# Patient Record
Sex: Female | Born: 1970 | Race: Black or African American | Hispanic: No | Marital: Single | Smoking: Never smoker
Health system: Southern US, Community
[De-identification: ages and names within clinical notes are randomized; demographics above are authoritative.]

## PROBLEM LIST (undated history)

## (undated) DIAGNOSIS — K219 Gastro-esophageal reflux disease without esophagitis: Secondary | ICD-10-CM

## (undated) DIAGNOSIS — F209 Schizophrenia, unspecified: Secondary | ICD-10-CM

## (undated) DIAGNOSIS — I10 Essential (primary) hypertension: Secondary | ICD-10-CM

## (undated) DIAGNOSIS — E669 Obesity, unspecified: Secondary | ICD-10-CM

---

## 2006-03-03 ENCOUNTER — Emergency Department: Payer: Self-pay | Admitting: Emergency Medicine

## 2008-11-02 ENCOUNTER — Ambulatory Visit: Payer: Self-pay | Admitting: Neurology

## 2009-03-21 ENCOUNTER — Emergency Department: Payer: Self-pay | Admitting: Emergency Medicine

## 2009-07-19 ENCOUNTER — Emergency Department: Payer: Self-pay | Admitting: Emergency Medicine

## 2009-10-20 ENCOUNTER — Emergency Department: Payer: Self-pay | Admitting: Emergency Medicine

## 2011-04-21 ENCOUNTER — Emergency Department: Payer: Self-pay | Admitting: Emergency Medicine

## 2011-06-05 ENCOUNTER — Emergency Department: Payer: Self-pay | Admitting: Emergency Medicine

## 2012-02-02 ENCOUNTER — Emergency Department: Payer: Self-pay | Admitting: Emergency Medicine

## 2012-03-03 ENCOUNTER — Emergency Department: Payer: Self-pay | Admitting: Emergency Medicine

## 2012-03-03 LAB — CBC
MCH: 29.2 pg (ref 26.0–34.0)
MCV: 86 fL (ref 80–100)
Platelet: 287 10*3/uL (ref 150–440)
RBC: 4.04 10*6/uL (ref 3.80–5.20)
RDW: 16.2 % — ABNORMAL HIGH (ref 11.5–14.5)
WBC: 7 10*3/uL (ref 3.6–11.0)

## 2012-03-03 LAB — COMPREHENSIVE METABOLIC PANEL
Albumin: 3.4 g/dL (ref 3.4–5.0)
Alkaline Phosphatase: 105 U/L (ref 50–136)
Bilirubin,Total: 0.2 mg/dL (ref 0.2–1.0)
Calcium, Total: 9.1 mg/dL (ref 8.5–10.1)
Co2: 30 mmol/L (ref 21–32)
Creatinine: 0.95 mg/dL (ref 0.60–1.30)
EGFR (Non-African Amer.): >60
Glucose: 102 mg/dL — ABNORMAL HIGH (ref 65–99)
SGOT(AST): 16 U/L (ref 15–37)
SGPT (ALT): 33 U/L (ref 12–78)
Sodium: 141 mmol/L (ref 136–145)

## 2012-03-03 LAB — URINALYSIS, COMPLETE
Bilirubin,UR: NEGATIVE
Glucose,UR: NEGATIVE mg/dL (ref 0–75)
Ketone: NEGATIVE
Leukocyte Esterase: NEGATIVE
RBC,UR: NONE SEEN /HPF (ref 0–5)
Squamous Epithelial: 10
WBC UR: 2 /HPF (ref 0–5)

## 2012-03-27 ENCOUNTER — Emergency Department: Payer: Self-pay | Admitting: Emergency Medicine

## 2012-03-27 LAB — COMPREHENSIVE METABOLIC PANEL
Alkaline Phosphatase: 113 U/L (ref 50–136)
Anion Gap: 7 (ref 7–16)
Bilirubin,Total: 0.2 mg/dL (ref 0.2–1.0)
Calcium, Total: 9.2 mg/dL (ref 8.5–10.1)
Chloride: 105 mmol/L (ref 98–107)
Creatinine: 0.94 mg/dL (ref 0.60–1.30)
EGFR (African American): >60
Glucose: 99 mg/dL (ref 65–99)
Potassium: 4 mmol/L (ref 3.5–5.1)
SGOT(AST): 14 U/L — ABNORMAL LOW (ref 15–37)
Sodium: 141 mmol/L (ref 136–145)

## 2012-03-27 LAB — URINALYSIS, COMPLETE
Blood: NEGATIVE
Ketone: NEGATIVE
Nitrite: NEGATIVE
Ph: 9 (ref 4.5–8.0)
Protein: NEGATIVE
RBC,UR: 1 /HPF (ref 0–5)
Specific Gravity: 1.008 (ref 1.003–1.030)
Squamous Epithelial: 1
WBC UR: 1 /HPF (ref 0–5)

## 2012-03-27 LAB — LIPASE, BLOOD: Lipase: 162 U/L (ref 73–393)

## 2012-03-27 LAB — CBC
MCHC: 32.9 g/dL (ref 32.0–36.0)
MCV: 87 fL (ref 80–100)
Platelet: 336 10*3/uL (ref 150–440)
RDW: 16 % — ABNORMAL HIGH (ref 11.5–14.5)

## 2012-05-23 ENCOUNTER — Emergency Department: Payer: Self-pay | Admitting: Emergency Medicine

## 2012-05-23 LAB — CBC
HCT: 39.7 % (ref 35.0–47.0)
HGB: 13 g/dL (ref 12.0–16.0)
MCH: 28.1 pg (ref 26.0–34.0)
MCV: 85 fL (ref 80–100)
RBC: 4.65 10*6/uL (ref 3.80–5.20)
RDW: 14.8 % — ABNORMAL HIGH (ref 11.5–14.5)
WBC: 7.3 10*3/uL (ref 3.6–11.0)

## 2012-05-23 LAB — URINALYSIS, COMPLETE
Bacteria: NONE SEEN
Bilirubin,UR: NEGATIVE
Blood: NEGATIVE
Glucose,UR: NEGATIVE mg/dL (ref 0–75)
Ketone: NEGATIVE
Ph: 8 (ref 4.5–8.0)
Specific Gravity: 1.01 (ref 1.003–1.030)
Squamous Epithelial: 7
WBC UR: 3 /HPF (ref 0–5)

## 2012-05-23 LAB — COMPREHENSIVE METABOLIC PANEL
Alkaline Phosphatase: 136 U/L (ref 50–136)
Anion Gap: 7 (ref 7–16)
BUN: 11 mg/dL (ref 7–18)
Bilirubin,Total: 0.2 mg/dL (ref 0.2–1.0)
Calcium, Total: 9.6 mg/dL (ref 8.5–10.1)
Chloride: 105 mmol/L (ref 98–107)
Co2: 27 mmol/L (ref 21–32)
Creatinine: 1.05 mg/dL (ref 0.60–1.30)
EGFR (African American): >60
EGFR (Non-African Amer.): >60
Glucose: 91 mg/dL (ref 65–99)
Sodium: 139 mmol/L (ref 136–145)
Total Protein: 9.1 g/dL — ABNORMAL HIGH (ref 6.4–8.2)

## 2012-05-23 LAB — LIPASE, BLOOD: Lipase: 151 U/L (ref 73–393)

## 2012-07-24 ENCOUNTER — Emergency Department: Payer: Self-pay | Admitting: Unknown Physician Specialty

## 2012-07-24 LAB — COMPREHENSIVE METABOLIC PANEL
Alkaline Phosphatase: 113 U/L (ref 50–136)
Anion Gap: 4 — ABNORMAL LOW (ref 7–16)
BUN: 10 mg/dL (ref 7–18)
Co2: 29 mmol/L (ref 21–32)
Creatinine: 1 mg/dL (ref 0.60–1.30)
EGFR (African American): >60
Osmolality: 283 (ref 275–301)
Potassium: 4.2 mmol/L (ref 3.5–5.1)
SGOT(AST): 20 U/L (ref 15–37)
SGPT (ALT): 30 U/L (ref 12–78)
Sodium: 142 mmol/L (ref 136–145)
Total Protein: 7.7 g/dL (ref 6.4–8.2)

## 2012-07-24 LAB — CBC
HGB: 11.5 g/dL — ABNORMAL LOW (ref 12.0–16.0)
MCH: 27.2 pg (ref 26.0–34.0)
MCHC: 32.4 g/dL (ref 32.0–36.0)
RBC: 4.22 10*6/uL (ref 3.80–5.20)
WBC: 6.5 10*3/uL (ref 3.6–11.0)

## 2012-07-24 LAB — TROPONIN I: Troponin-I: <0.02 ng/mL

## 2012-07-24 LAB — URINALYSIS, COMPLETE
Bacteria: NONE SEEN
Bilirubin,UR: NEGATIVE
Glucose,UR: NEGATIVE mg/dL (ref 0–75)
Nitrite: NEGATIVE
Ph: 8 (ref 4.5–8.0)
Protein: NEGATIVE
Specific Gravity: 1.002 (ref 1.003–1.030)
Squamous Epithelial: 11

## 2013-03-31 ENCOUNTER — Encounter (HOSPITAL_COMMUNITY): Payer: Self-pay | Admitting: Emergency Medicine

## 2013-03-31 ENCOUNTER — Emergency Department (HOSPITAL_COMMUNITY)
Admission: EM | Admit: 2013-03-31 | Discharge: 2013-03-31 | Disposition: A | Discharge status: Home / Self Care | Payer: BC Managed Care – PPO | Attending: Emergency Medicine | Admitting: Emergency Medicine

## 2013-03-31 DIAGNOSIS — Z3202 Encounter for pregnancy test, result negative: Secondary | ICD-10-CM | POA: Insufficient documentation

## 2013-03-31 DIAGNOSIS — R17 Unspecified jaundice: Secondary | ICD-10-CM | POA: Insufficient documentation

## 2013-03-31 DIAGNOSIS — E119 Type 2 diabetes mellitus without complications: Secondary | ICD-10-CM | POA: Insufficient documentation

## 2013-03-31 DIAGNOSIS — R1012 Left upper quadrant pain: Secondary | ICD-10-CM | POA: Insufficient documentation

## 2013-03-31 DIAGNOSIS — R109 Unspecified abdominal pain: Secondary | ICD-10-CM

## 2013-03-31 DIAGNOSIS — R5381 Other malaise: Secondary | ICD-10-CM | POA: Insufficient documentation

## 2013-03-31 DIAGNOSIS — R1013 Epigastric pain: Secondary | ICD-10-CM | POA: Insufficient documentation

## 2013-03-31 DIAGNOSIS — R6883 Chills (without fever): Secondary | ICD-10-CM | POA: Insufficient documentation

## 2013-03-31 DIAGNOSIS — F209 Schizophrenia, unspecified: Secondary | ICD-10-CM | POA: Insufficient documentation

## 2013-03-31 DIAGNOSIS — K219 Gastro-esophageal reflux disease without esophagitis: Secondary | ICD-10-CM | POA: Insufficient documentation

## 2013-03-31 DIAGNOSIS — R51 Headache: Secondary | ICD-10-CM | POA: Insufficient documentation

## 2013-03-31 DIAGNOSIS — E669 Obesity, unspecified: Secondary | ICD-10-CM | POA: Insufficient documentation

## 2013-03-31 DIAGNOSIS — Z79899 Other long term (current) drug therapy: Secondary | ICD-10-CM | POA: Insufficient documentation

## 2013-03-31 HISTORY — DX: Gastro-esophageal reflux disease without esophagitis: K21.9

## 2013-03-31 HISTORY — DX: Obesity, unspecified: E66.9

## 2013-03-31 HISTORY — DX: Schizophrenia, unspecified: F20.9

## 2013-03-31 LAB — COMPREHENSIVE METABOLIC PANEL
Albumin: 3.8 g/dL (ref 3.5–5.2)
Alkaline Phosphatase: 104 U/L (ref 39–117)
BUN: 13 mg/dL (ref 6–23)
CO2: 27 mEq/L (ref 19–32)
Calcium: 9.6 mg/dL (ref 8.4–10.5)
GFR calc Af Amer: 89 mL/min — ABNORMAL LOW (ref >90)
Glucose, Bld: 87 mg/dL (ref 70–99)
Potassium: 3.7 mEq/L (ref 3.5–5.1)
Sodium: 141 mEq/L (ref 135–145)
Total Protein: 8.2 g/dL (ref 6.0–8.3)

## 2013-03-31 LAB — CBC
HCT: 36.7 % (ref 36.0–46.0)
Hemoglobin: 12.1 g/dL (ref 12.0–15.0)
MCH: 28.3 pg (ref 26.0–34.0)
MCHC: 33 g/dL (ref 30.0–36.0)
MCV: 85.9 fL (ref 78.0–100.0)
RBC: 4.27 MIL/uL (ref 3.87–5.11)
RDW: 14.3 % (ref 11.5–15.5)
WBC: 7.2 10*3/uL (ref 4.0–10.5)

## 2013-03-31 LAB — URINALYSIS, ROUTINE W REFLEX MICROSCOPIC
Glucose, UA: NEGATIVE mg/dL
Ketones, ur: NEGATIVE mg/dL
Nitrite: NEGATIVE
Specific Gravity, Urine: 1.006 (ref 1.005–1.030)
pH: 6.5 (ref 5.0–8.0)

## 2013-03-31 LAB — LIPASE, BLOOD: Lipase: 35 U/L (ref 11–59)

## 2013-03-31 LAB — URINE MICROSCOPIC-ADD ON

## 2013-03-31 LAB — PREGNANCY, URINE: Preg Test, Ur: NEGATIVE

## 2013-03-31 MED ORDER — IBUPROFEN 800 MG PO TABS
800.0000 mg | ORAL_TABLET | Freq: Once | ORAL | Status: AC
  Administered 2013-03-31: 800 mg via ORAL
  Filled 2013-03-31: qty 4

## 2013-03-31 NOTE — ED Provider Notes (Signed)
CSN: [id number]     Arrival date & time 03/31/13  0215 History   First MD Initiated Contact with Patient 03/31/13 [phone]     Chief Complaint  Patient presents with  . Gastrophageal Reflux   (Consider location/radiation/quality/duration/timing/severity/associated sxs/prior Treatment) HPI Comments: The patient is a 42 year old female with a past medical history of schizophrenia, DM, presenting with multiple complains.  She reports chronic abdominal pain with multiple visits to [facility], [facility], and [facility].  She also complaints of a headache worsened by sensation of a BM and the valsalva manuver.  She reports lightheadedness. She also complains of "jumping" while falling asleep and has "a feeling something bad is going to happen".  LNMP January 16, 2013. The patient also reports Endoscopy in April 2014, abdominal US, H-pylori, abdominal CT scan.  Patient is a 42 y.o. female presenting with GERD. The history is provided by the patient.  Gastrophageal Reflux This is a chronic problem. The current episode started more than 1 year ago. The problem occurs constantly. The problem has been unchanged. Associated symptoms include abdominal pain, chills, fatigue, headaches and weakness. Pertinent negatives include no anorexia, chest pain, congestion, coughing, fever, myalgias, neck pain, numbness, rash or urinary symptoms. Treatments tried: Pepcid and Protonix. The treatment provided no relief.    Past Medical History  Diagnosis Date  . Schizophrenia   . Obesity   . GERD (gastroesophageal reflux disease)    History reviewed. No pertinent past surgical history. No family history on file. History  Substance Use Topics  . Smoking status: Never Smoker   . Smokeless tobacco: Not on file  . Alcohol Use: No   OB History   Grav Para Term Preterm Abortions TAB SAB Ect Mult Living                 Review of Systems  Constitutional: Positive for chills and fatigue. Negative for fever.  HENT: Negative  for congestion.   Respiratory: Negative for cough.   Cardiovascular: Negative for chest pain.  Gastrointestinal: Positive for abdominal pain. Negative for anorexia.  Musculoskeletal: Negative for myalgias and neck pain.  Skin: Negative for rash.  Neurological: Positive for weakness and headaches. Negative for numbness.    Allergies  Review of patient's allergies indicates no known allergies.  Home Medications   Current Outpatient Rx  Name  Route  Sig  Dispense  Refill  . carbamazepine (TEGRETOL) 100 MG chewable tablet   Oral   Chew 100 mg by mouth 2 (two) times daily.         . famotidine (PEPCID) 20 MG tablet   Oral   Take 20 mg by mouth 2 (two) times daily.         . pantoprazole (PROTONIX) 20 MG tablet   Oral   Take 20 mg by mouth 2 (two) times daily.          BP 123/67  Pulse 70  Temp(Src) 98.3 F (36.8 C) (Oral)  Resp 11  SpO2 100% Physical Exam  Nursing note and vitals reviewed. Constitutional: She is oriented to person, place, and time. Vital signs are normal. She appears well-developed and well-nourished. No distress.  HENT:  Head: Normocephalic and atraumatic.  Eyes: Scleral icterus is present.  Neck: Neck supple.  Cardiovascular: Normal rate, regular rhythm and normal heart sounds.   Pulmonary/Chest: Effort normal and breath sounds normal. No respiratory distress. She has no wheezes. She has no rales.  Abdominal: Soft. Bowel sounds are normal. She exhibits no distension.  There is tenderness in the epigastric area and left upper quadrant. There is no rebound, no guarding, no CVA tenderness, no tenderness at McBurney's point and negative Murphy's sign.  Abdominal exam limited by patient's body habitus.    Musculoskeletal: Normal range of motion.  Lymphadenopathy:    She has no cervical adenopathy.  Neurological: She is alert and oriented to person, place, and time.  Skin: Skin is warm and dry. No rash noted. She is not diaphoretic.    ED Course   Procedures (including critical care time) Labs Review Labs Reviewed  COMPREHENSIVE METABOLIC PANEL - Abnormal; Notable for the following:    GFR calc non Af Amer 77 (*)    GFR calc Af Amer 89 (*)    All other components within normal limits  URINALYSIS, ROUTINE W REFLEX MICROSCOPIC - Abnormal; Notable for the following:    APPearance CLOUDY (*)    Leukocytes, UA TRACE (*)    All other components within normal limits  URINE MICROSCOPIC-ADD ON - Abnormal; Notable for the following:    Squamous Epithelial / LPF MANY (*)    Bacteria, UA FEW (*)    All other components within normal limits  URINE CULTURE  LIPASE, BLOOD  CBC  PREGNANCY, URINE   Imaging Review No results found.  EKG Interpretation     Ventricular Rate:  87 PR Interval:  180 QRS Duration: 76 QT Interval:  365 QTC Calculation: 439 R Axis:   25 Text Interpretation:  Sinus rhythm Normal ECG No old tracing to compare            MDM   1. Abdominal pain    The patient with a year long history of abdominal pain that is not worsening.  Will order basic labs, lipase to evaluate pancrease given LUQ and epigastric tenderness.  Discussed patient history and condition with Dr. Hyacinth Meeker. EMR reveals the patient has had several imaging and testing in the past at various hospitals. Discussed test results and previous visits in the patient's medical record. He agrees no further workup given current state and lab results at this time and she is stable for discharge and to follow up with a Gi specialist or her PCP. Discussed lab results, imagine results, and treatment plan with the patient.  She she states "you doctors are just as worthless as the [facility] and [employer]".  Discussed in detail with the patient testing that we performed based on her symptoms and PE and that based on her history, labs and PE a specialists could follow her as an out patient for additional testing.  Offered to give the patient a referral to a GI  specialist but she refused to answer any further questions. I then stated I would have her follow up with her PCP and they could set her up with a GI specialist for further work up of her chronic abdominal pain.  Patient is stable for discharge at this time.  Meds given in ED:  Medications  ibuprofen (ADVIL,MOTRIN) tablet 800 mg (800 mg Oral Given 03/31/13 0430)    Discharge Medication List as of 03/31/2013  5:44 AM        Leotis Shames Doretha Imus, PA-C 04/04/13 1156

## 2013-03-31 NOTE — ED Notes (Signed)
Pt complains of pain in left hip, Across the upper abdomen

## 2013-03-31 NOTE — ED Notes (Signed)
Pt. reports progressing " acid reflux" with abdominal pain for the past several days , denies nausea or vomitting.

## 2013-03-31 NOTE — ED Notes (Signed)
Pt expressed that she doesn't feel safe at home or going out from home

## 2013-04-01 LAB — URINE CULTURE: Colony Count: 60000

## 2013-04-05 NOTE — ED Provider Notes (Signed)
42 year old female with a history of schizophrenia who also has some chronic abdominal pain and headaches, presents with a complaint of both headache and acid reflux. On exam the patient is obese, minimally tender in the epigastrium, no other significant tenderness, clear heart and lung sounds, normal mental status and neurologic exam.  Neurologic exam benign, no acute conditions that require further evaluation with radiology studies for laboratory workup other than what has arty been ordered, she has a essentially normal GFR, normal urinalysis, and an unremarkable EKG.  Multiple imaging studies have been obtained at other hospitals, she does not a surgical abdomen and appears stable for discharge  Medical screening examination/treatment/procedure(s) were conducted as a shared visit with non-physician practitioner(s) and myself.  I personally evaluated the patient during the encounter.       Vida Roller, MD 04/05/13 [phone]

## 2013-05-20 IMAGING — US ABDOMEN ULTRASOUND LIMITED
1 series · 14 of 25 positions shown · non-contrast
Comparison: none

REASON FOR EXAM: epigastric pain
COMMENTS:   Body Site: GB and Fossa, CBD, Head of Pancreas

[Series 1: abdomen ultrasound limited · 0.36mm/px · 14 of 25 slices shown]
[im 1/25]
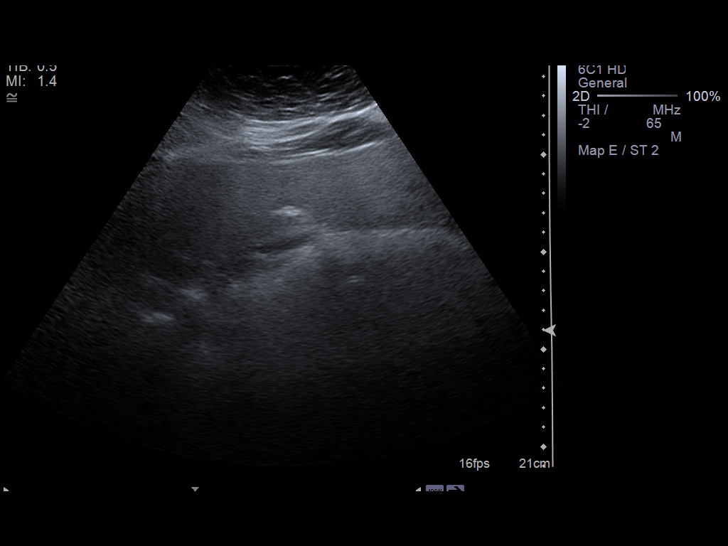
[im 3/25]
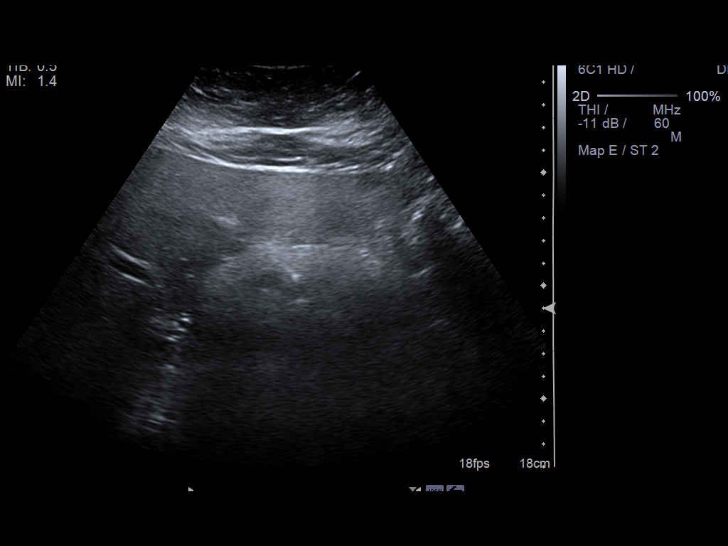
[im 5/25]
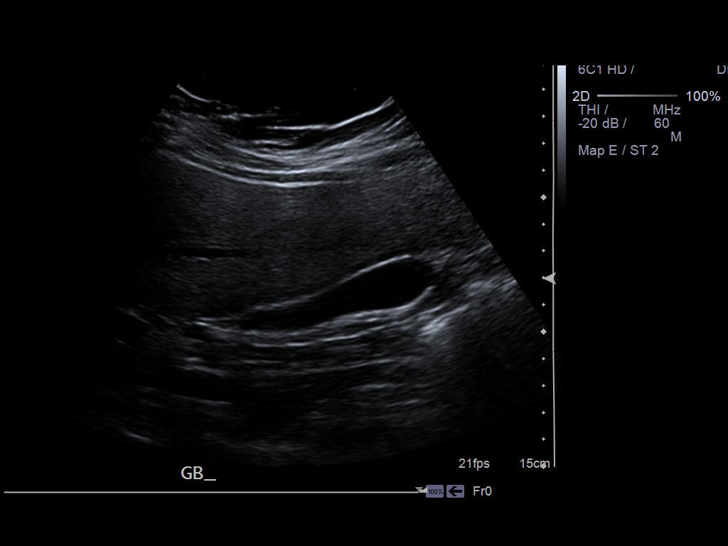
[im 7/25]
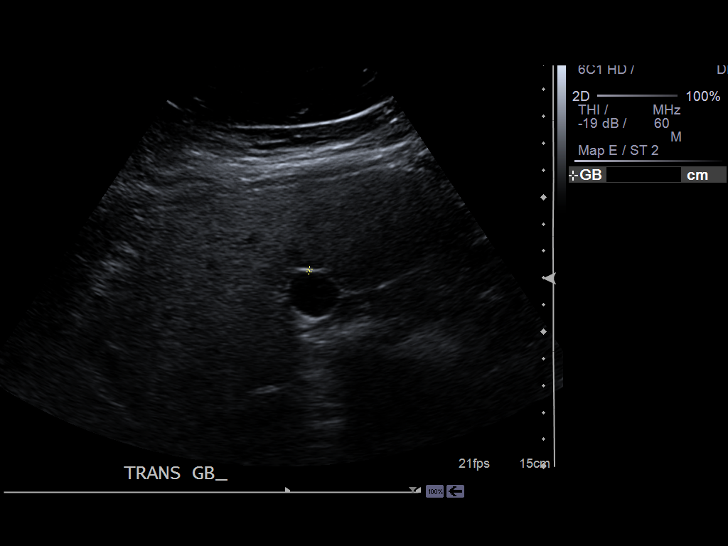
[im 9/25]
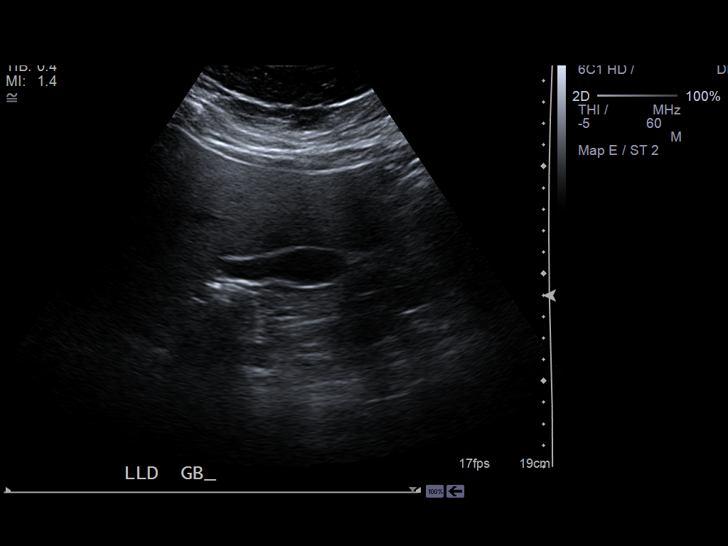
[im 10/25]
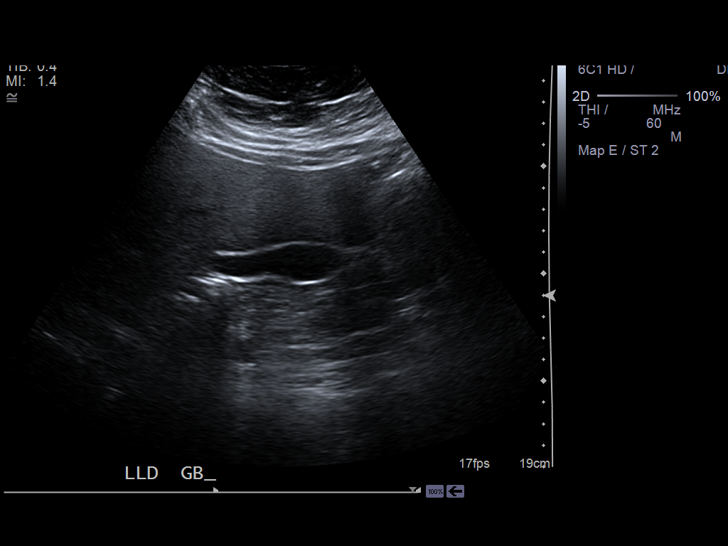
[im 12/25]
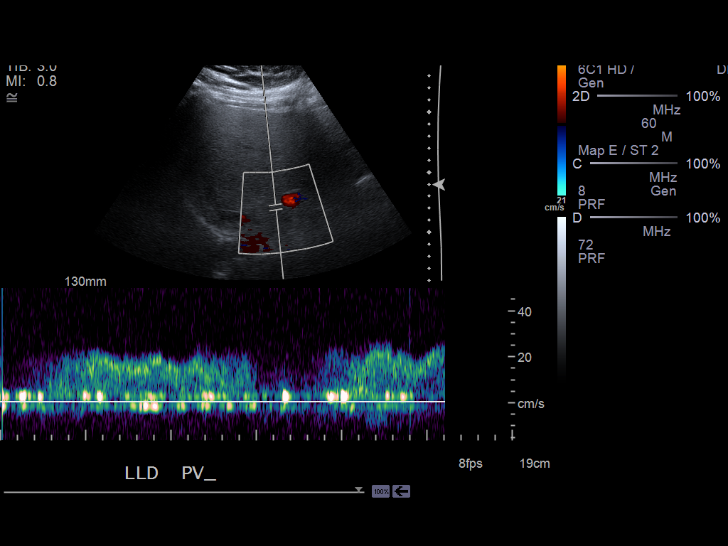
[im 14/25]
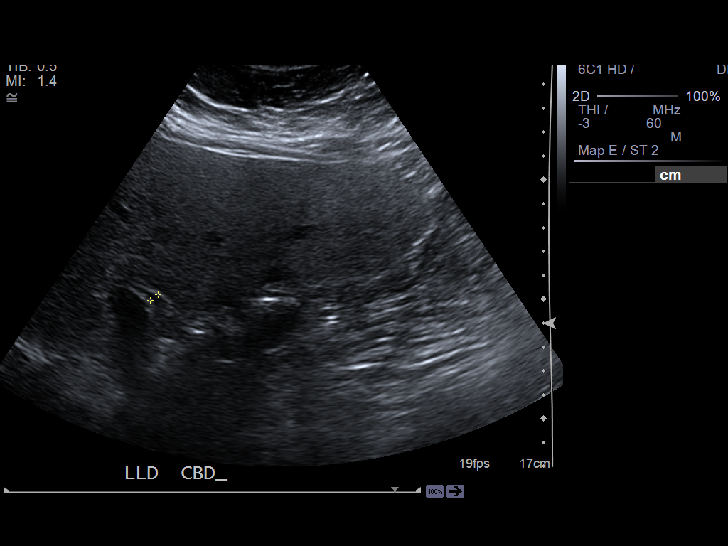
[im 16/25]
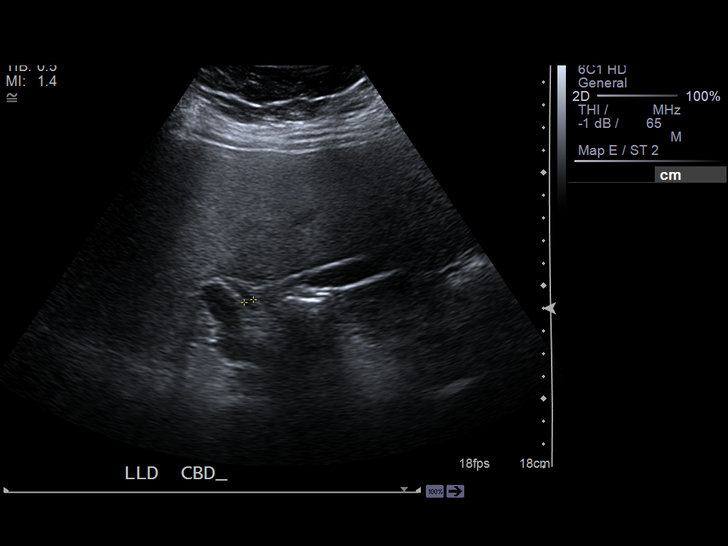
[im 17/25]
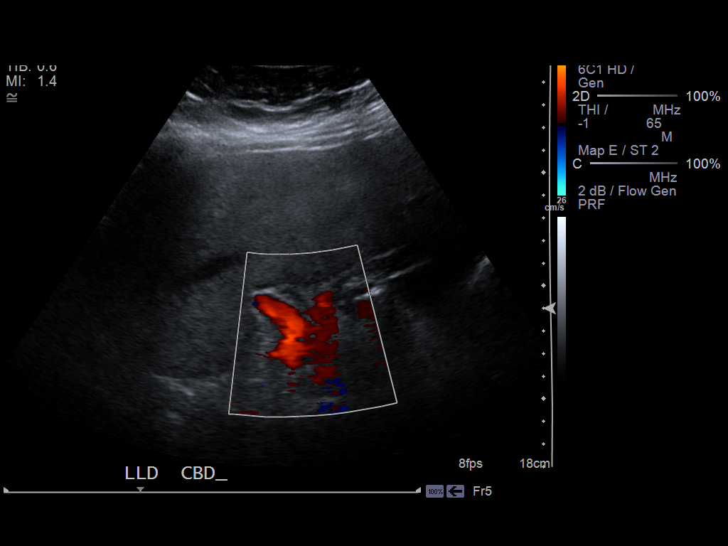
[im 19/25]
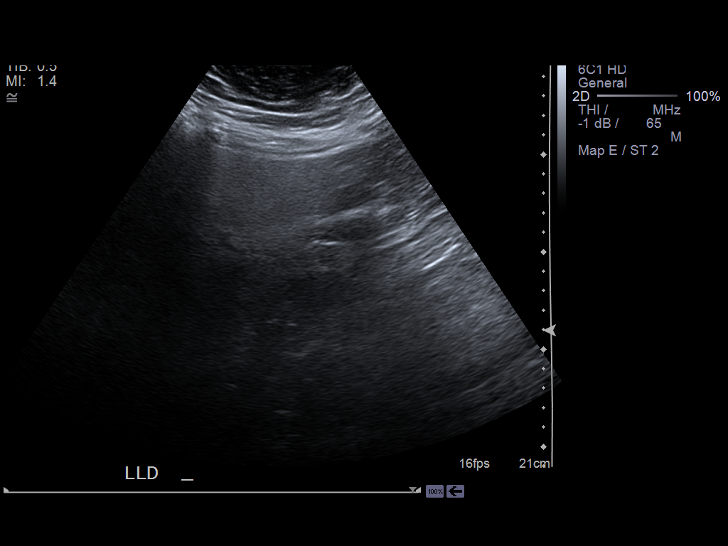
[im 21/25]
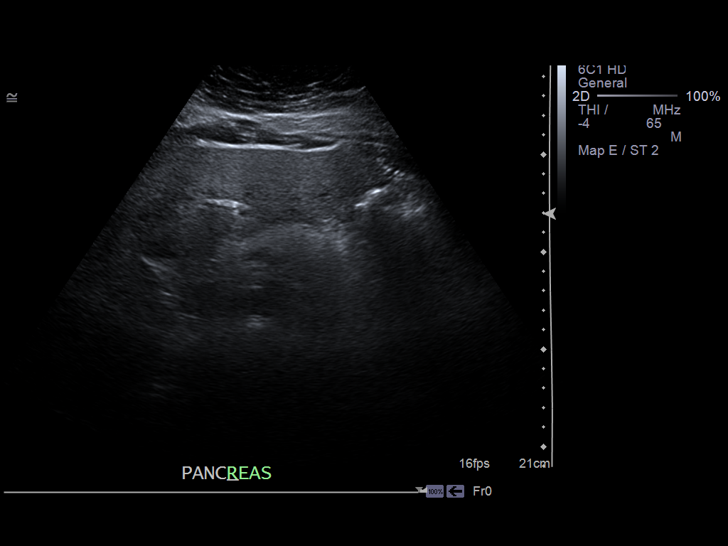
[im 23/25]
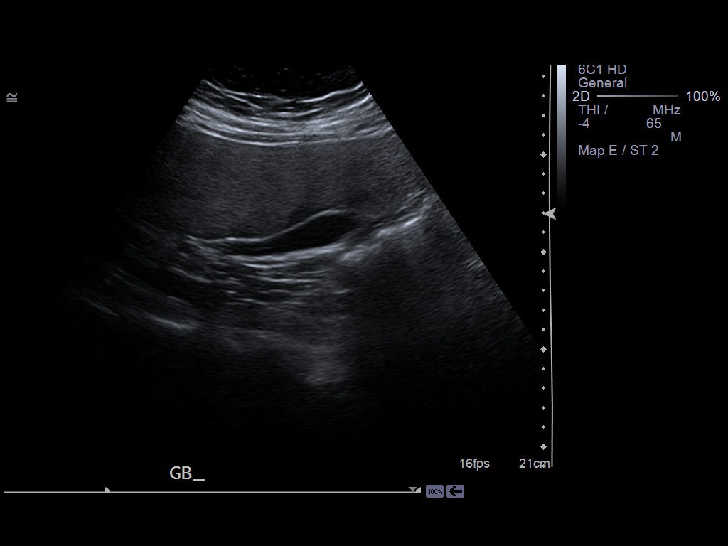
[im 25/25]
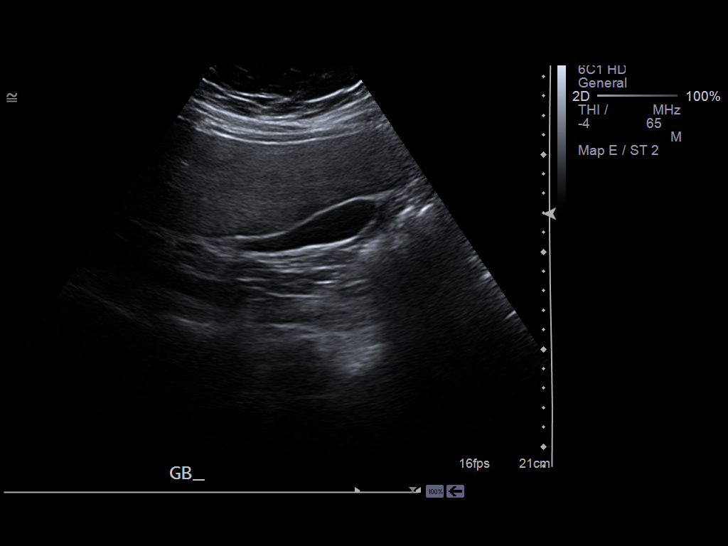

[14 of 25 positions shown; findings below may reference images not displayed]

PROCEDURE:     US  - US ABDOMEN LIMITED SURVEY  - March 03, 2012 [DATE]

RESULT:     The gallbladder shows no stones. The visualized pancreas appears
normal. Portions are secured by bowel gas. There is no sonographic Murphy's
sign. Gallbladder wall thickness is normal at 1.1 mm. The common bile duct
diameter is 4.0 mm. The portal venous flow appears normal. There is no
ascites.
IMPRESSION: Normal appearing right upper quadrant abdominal sonogram.
Limited visualization of the pancreas because of bowel gas.

[REDACTED]

## 2013-06-13 IMAGING — CT CT ABD-PELV W/ CM
1 of 2 series · 15 of 32 positions shown, 19 images · non-contrast
Comparison: none

REASON FOR EXAM: (1) left sided abbd pain, eval for diverticulitis; (2)
same
COMMENTS:

[Series 2: soft tissue · axial · 0.90mm/px · z∈[-931,-481]mm · 15 of 164 slices shown, 19 images]
[im 7/164  soft-tissue]
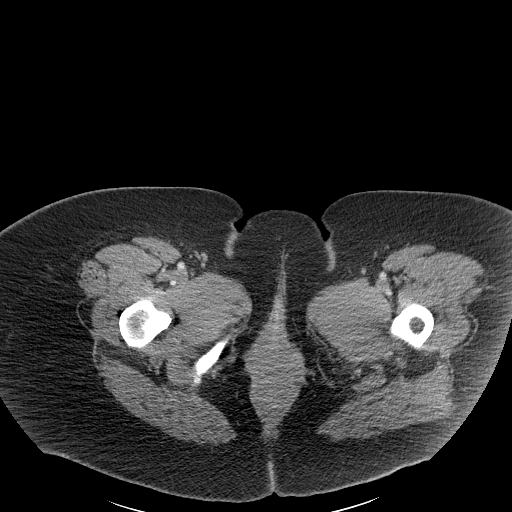
[im 7/164  bone]
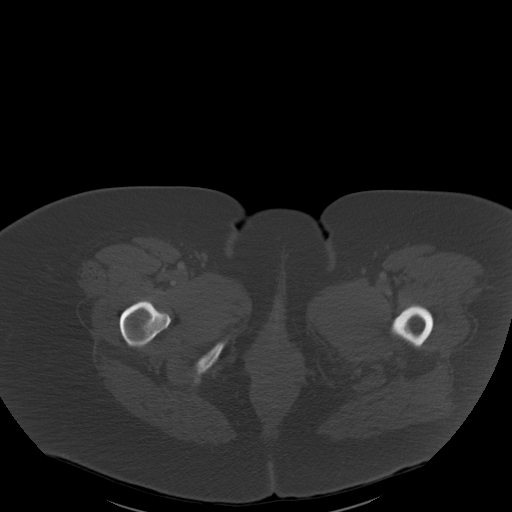
[im 21/164  soft-tissue]
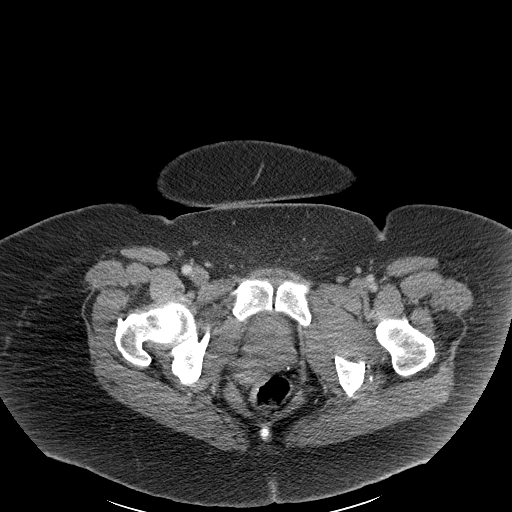
[im 34/164  soft-tissue]
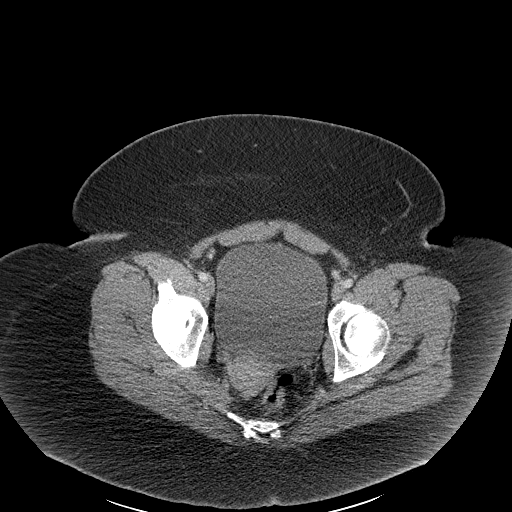
[im 48/164  soft-tissue]
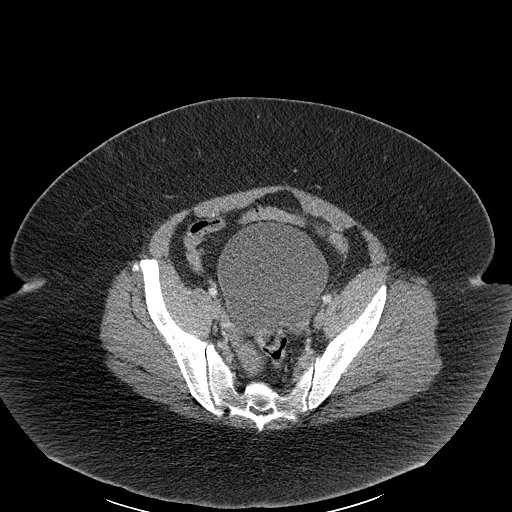
[im 55/164  soft-tissue]
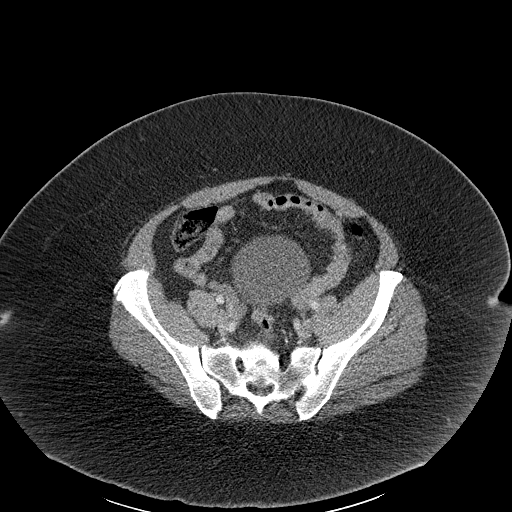
[im 68/164  soft-tissue]
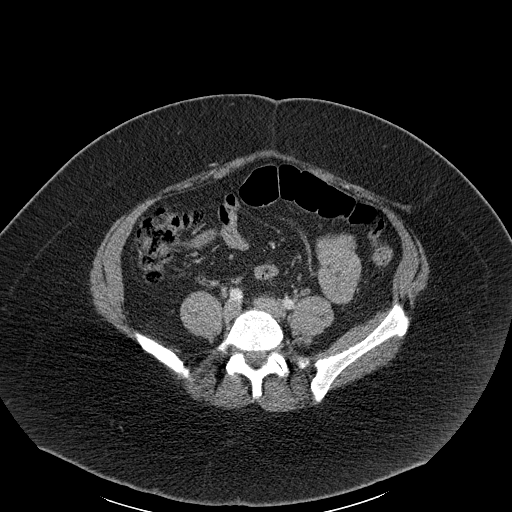
[im 82/164  soft-tissue]
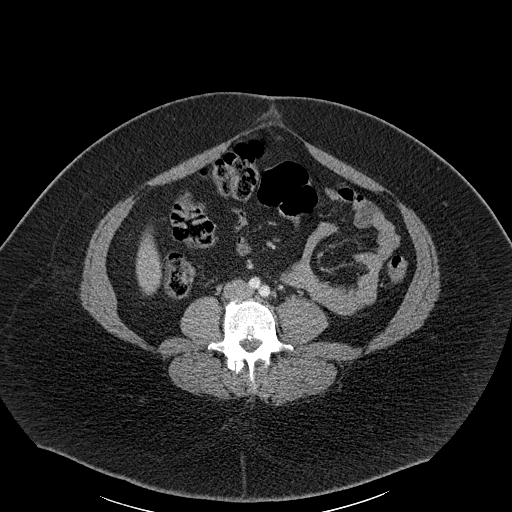
[im 96/164  soft-tissue]
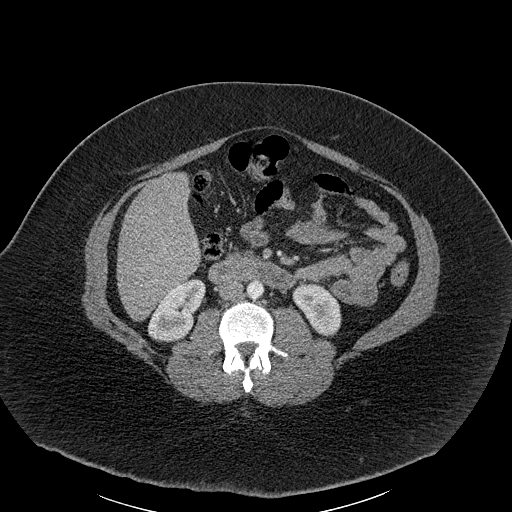
[im 109/164  soft-tissue]
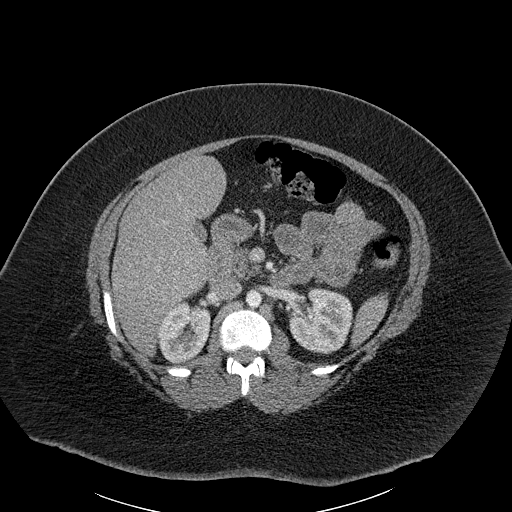
[im 109/164  bone]
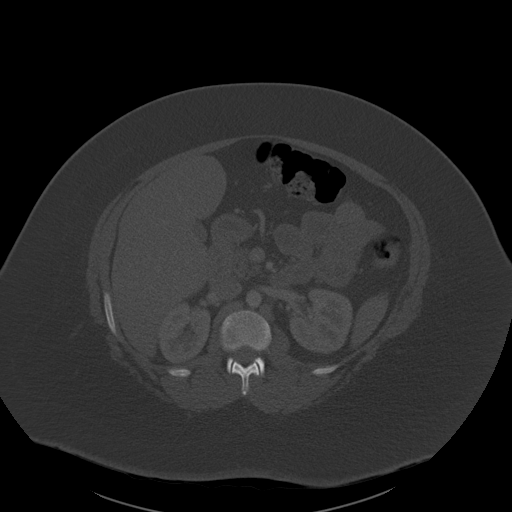
[im 116/164  soft-tissue]
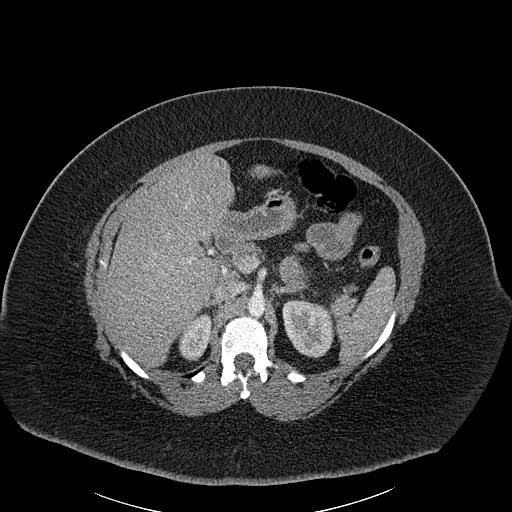
[im 130/164  soft-tissue]
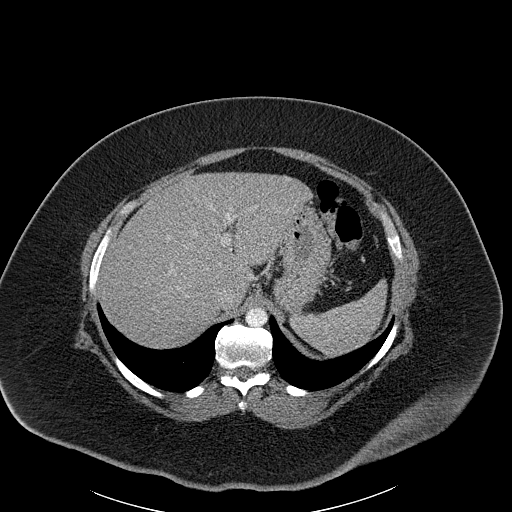
[im 136/164  lung]
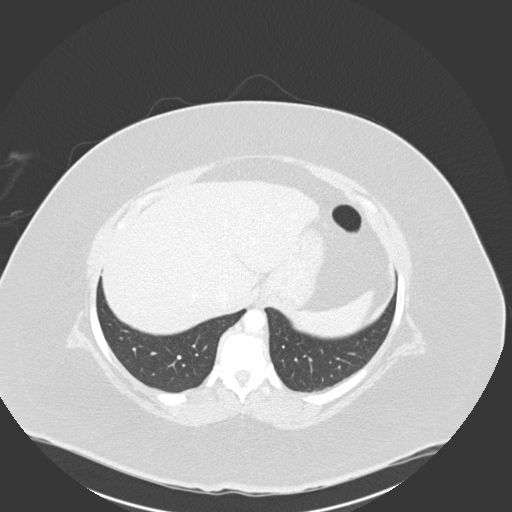
[im 143/164  soft-tissue]
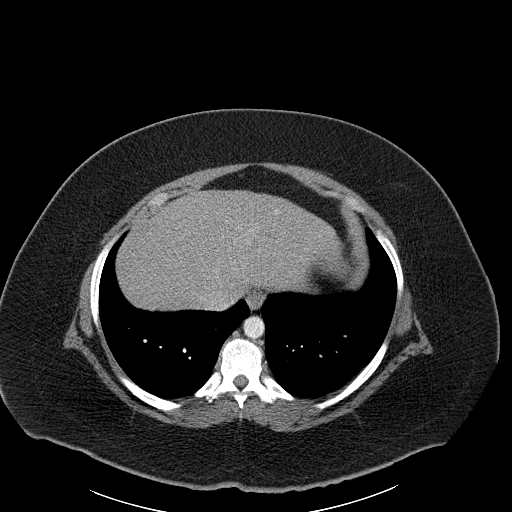
[im 143/164  lung]
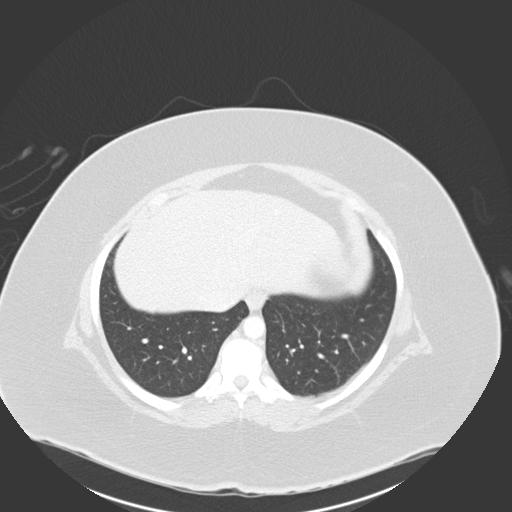
[im 150/164  lung]
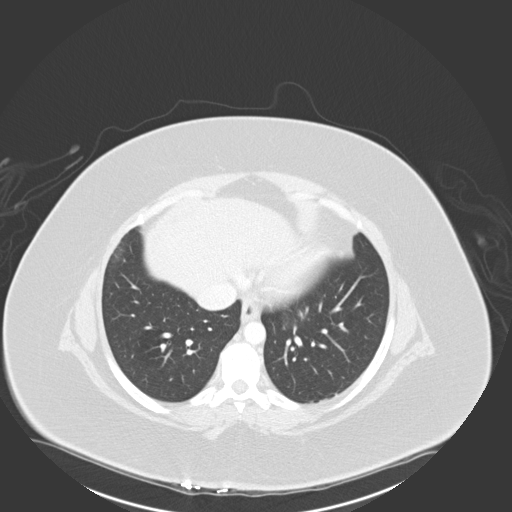
[im 157/164  soft-tissue]
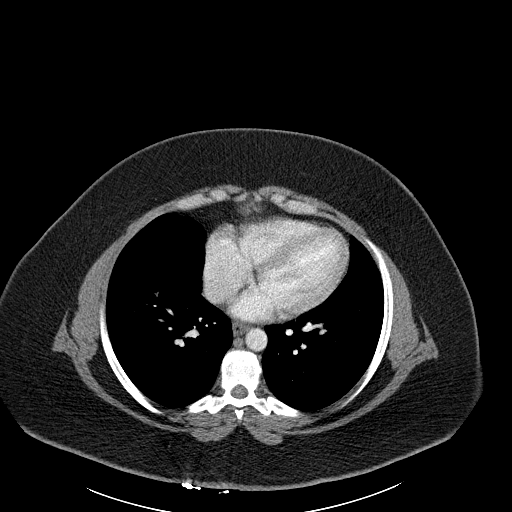
[im 157/164  lung]
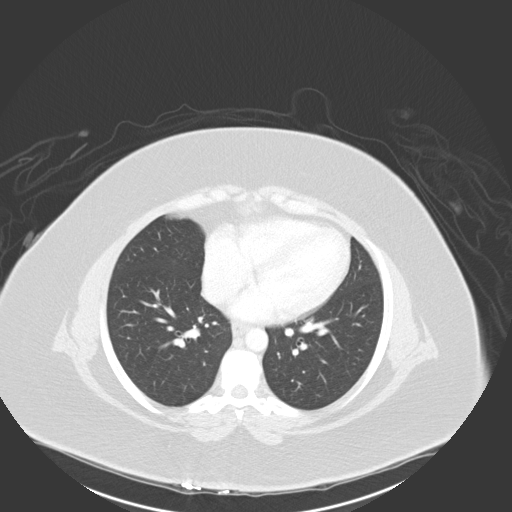

[15 of 32 positions shown; findings below may reference images not displayed]

PROCEDURE:     CT  - CT ABDOMEN / PELVIS  W  - March 27, 2012 [DATE]

RESULT:     CT of the abdomen and pelvis is performed with 100 mL of
6sovue-3MW iodinated intravenous contrast. The patient did not receive oral
contrast. There does not appear to be significant colonic diverticular
disease. There is no evidence of diverticulitis. There is a large fat filled
umbilical hernia. Urinary bladder contains a large amount of urine. The
uterus appears to be within normal limits. There is no adnexal mass.
Normal-appearing appendix is demonstrated. No abnormal colonic or small
bowel distention or wall thickening is evident. The liver, spleen, pancreas,
kidneys, aorta and adrenal glands appear unremarkable. There may be a tiny
stone in the gallbladder. Bony structures appear to be unremarkable. The
lung bases demonstrate no focal abnormality.
IMPRESSION: 1. Fat filled umbilical hernia.
2. Cannot exclude some tiny stones in the gallbladder. Correlate clinically.
3. No significant colonic diverticulosis. No evidence of diverticulitis.
4. Normal appearing appendix.
5. Large amount of urine in the bladder.

[REDACTED]

## 2013-12-12 ENCOUNTER — Emergency Department: Payer: Self-pay | Admitting: Emergency Medicine

## 2013-12-13 LAB — CBC
HCT: 36.1 % (ref 35.0–47.0)
HGB: 11.5 g/dL — ABNORMAL LOW (ref 12.0–16.0)
MCH: 27.2 pg (ref 26.0–34.0)
MCHC: 31.9 g/dL — ABNORMAL LOW (ref 32.0–36.0)
MCV: 85 fL (ref 80–100)
PLATELETS: 311 10*3/uL (ref 150–440)
RBC: 4.23 10*6/uL (ref 3.80–5.20)
RDW: 15.3 % — ABNORMAL HIGH (ref 11.5–14.5)
WBC: 8.5 10*3/uL (ref 3.6–11.0)

## 2014-04-02 ENCOUNTER — Emergency Department: Payer: Self-pay | Admitting: Emergency Medicine

## 2014-09-07 ENCOUNTER — Emergency Department
Admit: 2014-09-07 | Disposition: A | Discharge status: Home / Self Care | Payer: Self-pay | Admitting: Emergency Medicine

## 2014-09-07 LAB — COMPREHENSIVE METABOLIC PANEL
ALK PHOS: 94 U/L
ANION GAP: 7 (ref 7–16)
AST: 22 U/L
Albumin: 3.5 g/dL
BUN: 13 mg/dL
Bilirubin,Total: 0.2 mg/dL — ABNORMAL LOW
CALCIUM: 8.9 mg/dL
CHLORIDE: 105 mmol/L
Co2: 27 mmol/L
Creatinine: 0.9 mg/dL
EGFR (African American): >60
EGFR (Non-African Amer.): >60
Glucose: 121 mg/dL — ABNORMAL HIGH
POTASSIUM: 4 mmol/L
SGPT (ALT): 22 U/L
Sodium: 139 mmol/L
Total Protein: 7.2 g/dL

## 2014-09-07 LAB — CBC WITH DIFFERENTIAL/PLATELET
BASOS ABS: 0.1 10*3/uL (ref 0.0–0.1)
BASOS PCT: 0.9 %
EOS ABS: 0.2 10*3/uL (ref 0.0–0.7)
EOS PCT: 2.4 %
HCT: 32.7 % — ABNORMAL LOW (ref 35.0–47.0)
HGB: 10.4 g/dL — AB (ref 12.0–16.0)
LYMPHS ABS: 2.6 10*3/uL (ref 1.0–3.6)
Lymphocyte %: 39.7 %
MCH: 25.8 pg — AB (ref 26.0–34.0)
MCHC: 31.7 g/dL — ABNORMAL LOW (ref 32.0–36.0)
MCV: 81 fL (ref 80–100)
MONO ABS: 0.5 x10 3/mm (ref 0.2–0.9)
Monocyte %: 7.4 %
Neutrophil #: 3.2 10*3/uL (ref 1.4–6.5)
Neutrophil %: 49.6 %
PLATELETS: 307 10*3/uL (ref 150–440)
RBC: 4.02 10*6/uL (ref 3.80–5.20)
RDW: 15.3 % — ABNORMAL HIGH (ref 11.5–14.5)
WBC: 6.5 10*3/uL (ref 3.6–11.0)

## 2014-09-07 LAB — URINALYSIS, COMPLETE
BACTERIA: NONE SEEN
Bilirubin,UR: NEGATIVE
Blood: NEGATIVE
GLUCOSE, UR: NEGATIVE mg/dL (ref 0–75)
Ketone: NEGATIVE
LEUKOCYTE ESTERASE: NEGATIVE
NITRITE: NEGATIVE
PH: 6 (ref 4.5–8.0)
Protein: NEGATIVE
RBC,UR: NONE SEEN /HPF (ref 0–5)
Specific Gravity: 1.003 (ref 1.003–1.030)

## 2014-09-07 LAB — LIPASE, BLOOD: LIPASE: 35 U/L

## 2014-12-04 ENCOUNTER — Emergency Department
Admission: EM | Admit: 2014-12-04 | Discharge: 2014-12-04 | Disposition: A | Discharge status: Home / Self Care | Payer: BC Managed Care – PPO | Attending: Emergency Medicine | Admitting: Emergency Medicine

## 2014-12-04 DIAGNOSIS — K6289 Other specified diseases of anus and rectum: Secondary | ICD-10-CM | POA: Insufficient documentation

## 2014-12-04 MED ORDER — HYDROCORTISONE 2.5 % RE CREA: 1.0000 "application " | TOPICAL_CREAM | Freq: Two times a day (BID) | RECTAL | Status: AC

## 2014-12-04 NOTE — ED Provider Notes (Signed)
[facility] Emergency Department Provider Note     Time seen: ----------------------------------------- 7:54 AM on 12/04/2014 -----------------------------------------    I have reviewed the triage vital signs and the nursing notes.   HISTORY  Chief Complaint Rectal Pain    HPI Kari Mueller is a 44 y.o. female who presents ER with worsening rectal pain. Rectal pain is severe this time. Patient states she was diagnosed with an anal fissure about 3 weeks ago and was put on a gel to apply to this region. She states the pain is not gotten any better, denies any fever. States she may have had some chills.   Past Medical History  Diagnosis Date  . Schizophrenia   . Obesity   . GERD (gastroesophageal reflux disease)     There are no active problems to display for this patient.   No past surgical history on file.  Allergies Review of patient's allergies indicates no known allergies.  Social History History  Substance Use Topics  . Smoking status: Never Smoker   . Smokeless tobacco: Not on file  . Alcohol Use: No    Review of Systems Constitutional: Negative for fever. Respiratory: Negative for shortness of breath. Gastrointestinal: Negative for abdominal pain, vomiting and diarrhea. positive for rectal pain  Skin: Negative for rash. Neurological: Negative for headaches, focal weakness or numbness.  10-point ROS otherwise negative.  ____________________________________________   PHYSICAL EXAM:  VITAL SIGNS: ED Triage Vitals  Enc Vitals Group     BP 12/04/14 0747 147/89 mmHg     Pulse Rate 12/04/14 0747 88     Resp 12/04/14 0747 18     Temp 12/04/14 0747 98.3 F (36.8 C)     Temp Source 12/04/14 0747 Oral     SpO2 12/04/14 0747 97 %     Weight 12/04/14 0747 312 lb (141.522 kg)     Height 12/04/14 0747 5\' 6"  (1.676 m)     Head Cir --      Peak Flow --      Pain Score 12/04/14 0752 9     Pain Loc --      Pain Edu? --    Excl. in GC? --    Constitutional: Alert and oriented. Well appearing and in no distress. Rectal:  Patient with soft external hemorrhoids, rectal area is diffusely tender to touch, there does appear to be a possible anal fissure posteriorly Musculoskeletal: Nontender with normal range of motion in all extremities. No joint effusions.  No lower extremity tenderness nor edema. Neurologic:  Normal speech and language. Skin:  Skin is warm, dry and intact. No rash noted. Psychiatric: Mood and affect are normal.   ____________________________________________  ED COURSE:  Pertinent labs & imaging results that were available during my care of the patient were reviewed by me and considered in my medical decision making (see chart for details). I will review her primary care doctor's office notes. We'll change her rectal medication.  ____________________________________________    RADIOLOGY   none   ____________________________________________  FINAL ASSESSMENT AND PLAN  Rectal pain  Plan: Patient be switched to Anusol HC. She does have some hemorrhoids. She stable for outpatient follow-up with her primary care doctor.  Emily FilbertWilliams, Jonathan E, MD   Emily FilbertJonathan E Williams, MD 12/04/14 [phone]

## 2014-12-04 NOTE — ED Notes (Signed)
Patient to ED with c/o worsening rectal pain.

## 2014-12-04 NOTE — Discharge Instructions (Signed)
Proctitis °Proctitis is the swelling and soreness (inflammation) of the lining of the rectum. The rectum is at the end of the large intestine and is attached to the anus. The inflammation causes pain and discomfort. It may be short-term (acute) or long-lasting (chronic). °CAUSES °Inflammation in the rectum can be caused by many things, such as: °· Sexually transmitted diseases (STDs). °· Infection. °· Anal-rectal trauma or injury. °· Ulcerative colitis or Crohn's disease. °· Radiation therapy directed near the rectum. °· Antibiotic therapy. °SYMPTOMS °· Sudden, uncomfortable, and frequent urge to have a bowel movement. °· Anal or rectal pain. °· Abdominal cramping or pain. °· Sensation that the rectum is full. °· Rectal bleeding. °· Pus or mucus discharge from anus. °· Diarrhea or frequent soft, loose stools. °DIAGNOSIS °Diagnosis may include the following: °· A history and physical exam. °· An STD test. °· Blood tests. °· Stool tests. °· Rectal culture. °· A procedure to evaluate the anal canal (anoscopy). °· Procedures to look at part, or the entire large bowel (sigmoidoscopy, colonoscopy). °TREATMENT °Treatment of proctitis depends on the cause. Reducing the symptoms of inflammation and eliminating infection are the main goals of treatment. Treatment may include: °· Home remedies and lifestyle, such as sitz baths and avoiding food right before bedtime. °· Topical ointments, foams, suppositories, or enemas, such as corticosteroids or anti-inflammatories. °· Antibiotic or antiviral medicines to treat infection or to control harmful bacteria. °· Medicines to control diarrhea, soften stools, and reduce pain. °· Medicines to suppress the immune system. °· Avoiding the activity that caused rectal trauma. °· Nutritional, dietary, or herbal supplements. °· Heat or laser therapy for persistent bleeding. °· A dilation procedure to enlarge a narrowed rectum. °· Surgery, though rare, may be necessary to repair damaged rectal  lining. °HOME CARE INSTRUCTIONS °Only take medicines that are recommended or approved by your caregiver. Do not take anti-diarrhea medicine without your caregiver's approval. °SEEK MEDICAL CARE IF: °· You often experience one or more of the symptoms noted above. °· You keep experiencing symptoms after treatment. °· You have questions or concerns about your symptoms or treatment plan. °MAKE SURE YOU: °· Understand these instructions. °· Will watch your condition. °· Will get help right away if you are not doing well or get worse. °FOR MORE INFORMATION °National Institute of Diabetes and Digestive and Kidney Disease (NIDDK): www.digestive.niddk.hih.gov/ °Document Released: 04/30/2011 Document Revised: 09/05/2012 Document Reviewed: 04/30/2011 °ExitCare® Patient Information ©2015 [employer]. This information is not intended to replace advice given to you by your health care provider. Make sure you discuss any questions you have with your health care provider. ° °

## 2016-10-31 ENCOUNTER — Encounter: Payer: Self-pay | Admitting: Emergency Medicine

## 2016-10-31 ENCOUNTER — Emergency Department
Admission: EM | Admit: 2016-10-31 | Discharge: 2016-10-31 | Disposition: A | Discharge status: Home / Self Care | Payer: BC Managed Care – PPO | Attending: Emergency Medicine | Admitting: Emergency Medicine

## 2016-10-31 DIAGNOSIS — Z79899 Other long term (current) drug therapy: Secondary | ICD-10-CM | POA: Insufficient documentation

## 2016-10-31 DIAGNOSIS — J029 Acute pharyngitis, unspecified: Secondary | ICD-10-CM | POA: Insufficient documentation

## 2016-10-31 LAB — POCT RAPID STREP A: STREPTOCOCCUS, GROUP A SCREEN (DIRECT): NEGATIVE

## 2016-10-31 MED ORDER — AMOXICILLIN-POT CLAVULANATE 875-125 MG PO TABS: 1.0000 | ORAL_TABLET | Freq: Two times a day (BID) | ORAL | 0 refills | Status: AC

## 2016-10-31 MED ORDER — LIDOCAINE VISCOUS 2 % MT SOLN: 10.0000 mL | OROMUCOSAL | 0 refills | Status: AC | PRN

## 2016-10-31 MED ORDER — PREDNISONE 10 MG (21) PO TBPK: ORAL_TABLET | ORAL | 0 refills | Status: AC

## 2016-10-31 NOTE — ED Provider Notes (Signed)
[facility] Emergency Department Provider Note  ____________________________________________  Time seen: Approximately 11:38 AM  I have reviewed the triage vital signs and the nursing notes.   HISTORY  Chief Complaint Sore Throat    HPI Kari Mueller is a 46 y.o. female that presents to the emergency department with 2 weeks ofsore throat, nasal congestion, and nonproductive cough. She is eating and drinking normally but with pain. Patient states that she has been taking TheraFlu but symptoms have continued to get worse. She does not have diabetes. She denies fever, shortness breath, chest pain, nausea, vomiting, abdominal pain.   Past Medical History:  Diagnosis Date  . GERD (gastroesophageal reflux disease)   . Obesity   . Schizophrenia (HCC)     There are no active problems to display for this patient.   History reviewed. No pertinent surgical history.  Prior to Admission medications   Medication Sig Start Date End Date Taking? Authorizing Provider  amoxicillin-clavulanate (AUGMENTIN) 875-125 MG tablet Take 1 tablet by mouth 2 (two) times daily. 10/31/16 11/10/16  Enid Derry, PA-C  carbamazepine (TEGRETOL) 100 MG chewable tablet Chew 100 mg by mouth 2 (two) times daily.    [provider]  famotidine (PEPCID) 20 MG tablet Take 20 mg by mouth 2 (two) times daily.    [provider]  hydrocortisone (ANUSOL-HC) 2.5 % rectal cream Place 1 application rectally 2 (two) times daily. 12/04/14   Emily Filbert, MD  lidocaine (XYLOCAINE) 2 % solution Use as directed 10 mLs in the mouth or throat as needed for mouth pain. 10/31/16   Enid Derry, PA-C  pantoprazole (PROTONIX) 20 MG tablet Take 20 mg by mouth 2 (two) times daily.    [provider]  predniSONE (STERAPRED UNI-PAK 21 TAB) 10 MG (21) TBPK tablet Take 6 tablets on day 1, take 5 tablets on day 2, take 4 tablets on day 3, take 3 tablets on day 4, take 2 tablets on  day 5, take 1 tablet on day 6 10/31/16   Enid Derry, PA-C    Allergies Patient has no known allergies.  No family history on file.  Social History Social History  Substance Use Topics  . Smoking status: Never Smoker  . Smokeless tobacco: Not on file  . Alcohol use No     Review of Systems  Constitutional: No fever/chills Eyes: No visual changes. No discharge. ENT: Positive for congestion and rhinorrhea. Positive for sore throat. Cardiovascular: No chest pain. Respiratory: Positive for cough. No SOB. Gastrointestinal: No abdominal pain.  No nausea, no vomiting.  No diarrhea.  No constipation. Musculoskeletal: Negative for musculoskeletal pain. Skin: Negative for rash, abrasions, lacerations, ecchymosis. Neurological: Negative for headaches.   ____________________________________________   PHYSICAL EXAM:  VITAL SIGNS: ED Triage Vitals  Enc Vitals Group     BP 10/31/16 0956 (!) 146/87     Pulse Rate 10/31/16 0956 87     Resp 10/31/16 0956 20     Temp 10/31/16 0956 98.2 F (36.8 C)     Temp Source 10/31/16 0956 Oral     SpO2 10/31/16 0956 100 %     Weight 10/31/16 0957 (!) 314 lb (142.4 kg)     Height 10/31/16 0957 5\' 7"  (1.702 m)     Head Circumference --      Peak Flow --      Pain Score 10/31/16 0955 8     Pain Loc --      Pain Edu? --  Excl. in GC? --      Constitutional: Alert and oriented. Well appearing and in no acute distress. Eyes: Conjunctivae are normal. PERRL. EOMI. No discharge. Head: Atraumatic. ENT: No frontal and maxillary sinus tenderness.      Ears: Tympanic membranes pearly gray with good landmarks. No discharge.      Nose: Mild congestion/rhinnorhea.      Mouth/Throat: Mucous membranes are moist. Oropharynx erythematous. Tonsils not enlarged. No exudates. Uvula midline. Neck: No stridor.   Hematological/Lymphatic/Immunilogical: No cervical lymphadenopathy. Cardiovascular: Normal rate, regular rhythm.  Good peripheral  circulation. Respiratory: Normal respiratory effort without tachypnea or retractions. Lungs CTAB. Good air entry to the bases with no decreased or absent breath sounds. Gastrointestinal: Bowel sounds 4 quadrants. Soft and nontender to palpation. No guarding or rigidity. No palpable masses. No distention. Musculoskeletal: Full range of motion to all extremities. No gross deformities appreciated. Neurologic:  Normal speech and language. No gross focal neurologic deficits are appreciated.  Skin:  Skin is warm, dry and intact. No rash noted.   ____________________________________________   LABS (all labs ordered are listed, but only abnormal results are displayed)  Labs Reviewed  POCT RAPID STREP A   ____________________________________________  EKG   ____________________________________________  RADIOLOGY  No results found.  ____________________________________________    PROCEDURES  Procedure(s) performed:    Procedures    Medications - No data to display   ____________________________________________   INITIAL IMPRESSION / ASSESSMENT AND PLAN / ED COURSE  Pertinent labs & imaging results that were available during my care of the patient were reviewed by me and considered in my medical decision making (see chart for details).  Review of the [place] CSRS was performed in accordance of the [facility] prior to dispensing any controlled drugs.  Patient's diagnosis is consistent with upper respiratory infection and pharyngitis. Vital signs and exam are reassuring. Strep negative. Patient states that her sore throat is the worst symptom she also has nasal congestion and nonproductive cough. Patient appears well and is staying well hydrated.  Patient feels comfortable going home. Patient will be discharged home with prescriptions for Augmentin, prednisone, viscous lidocaine. Patient is to follow up with PCP as needed or otherwise directed. Patient is given ED precautions to return to  the ED for any worsening or new symptoms.     ____________________________________________  FINAL CLINICAL IMPRESSION(S) / ED DIAGNOSES  Final diagnoses:  Pharyngitis, unspecified etiology      NEW MEDICATIONS STARTED DURING THIS VISIT:  Discharge Medication List as of 10/31/2016 11:44 AM    START taking these medications   Details  amoxicillin-clavulanate (AUGMENTIN) 875-125 MG tablet Take 1 tablet by mouth 2 (two) times daily., Starting Sat 10/31/2016, Until Tue 11/10/2016, Print    lidocaine (XYLOCAINE) 2 % solution Use as directed 10 mLs in the mouth or throat as needed for mouth pain., Starting Sat 10/31/2016, Print    predniSONE (STERAPRED UNI-PAK 21 TAB) 10 MG (21) TBPK tablet Take 6 tablets on day 1, take 5 tablets on day 2, take 4 tablets on day 3, take 3 tablets on day 4, take 2 tablets on day 5, take 1 tablet on day 6, Print            This chart was dictated using voice recognition software/Dragon. Despite best efforts to proofread, errors can occur which can change the meaning. Any change was purely unintentional.    Enid DerryWagner, Cire Deyarmin, PA-C 10/31/16 1223    Governor RooksLord, Rebecca, MD 10/31/16 [phone]

## 2016-10-31 NOTE — ED Triage Notes (Signed)
Sore throat x 2 weeks

## 2016-10-31 NOTE — ED Notes (Signed)
See triage note   States she developed sore throat about 2 weeks ago describes throat as scratchy  Runny nose  No fever

## 2017-01-16 ENCOUNTER — Encounter: Payer: Self-pay | Admitting: Emergency Medicine

## 2017-01-16 ENCOUNTER — Emergency Department
Admission: EM | Admit: 2017-01-16 | Discharge: 2017-01-16 | Disposition: A | Discharge status: Home / Self Care | Payer: BC Managed Care – PPO | Attending: Emergency Medicine | Admitting: Emergency Medicine

## 2017-01-16 DIAGNOSIS — Z79899 Other long term (current) drug therapy: Secondary | ICD-10-CM | POA: Insufficient documentation

## 2017-01-16 DIAGNOSIS — R1012 Left upper quadrant pain: Secondary | ICD-10-CM | POA: Diagnosis not present

## 2017-01-16 LAB — COMPREHENSIVE METABOLIC PANEL
ALBUMIN: 3.8 g/dL (ref 3.5–5.0)
ALK PHOS: 92 U/L (ref 38–126)
ALT: 23 U/L (ref 14–54)
AST: 20 U/L (ref 15–41)
Anion gap: 7 (ref 5–15)
BUN: 9 mg/dL (ref 6–20)
CALCIUM: 9.1 mg/dL (ref 8.9–10.3)
CHLORIDE: 106 mmol/L (ref 101–111)
CO2: 27 mmol/L (ref 22–32)
CREATININE: 0.8 mg/dL (ref 0.44–1.00)
GFR calc Af Amer: >60 mL/min (ref >60)
GFR calc non Af Amer: >60 mL/min (ref >60)
GLUCOSE: 89 mg/dL (ref 65–99)
Potassium: 4.2 mmol/L (ref 3.5–5.1)
SODIUM: 140 mmol/L (ref 135–145)
Total Bilirubin: 0.4 mg/dL (ref 0.3–1.2)
Total Protein: 7.9 g/dL (ref 6.5–8.1)

## 2017-01-16 LAB — URINALYSIS, COMPLETE (UACMP) WITH MICROSCOPIC
Bacteria, UA: NONE SEEN
Bilirubin Urine: NEGATIVE
GLUCOSE, UA: NEGATIVE mg/dL
HGB URINE DIPSTICK: NEGATIVE
Ketones, ur: NEGATIVE mg/dL
Nitrite: NEGATIVE
PROTEIN: NEGATIVE mg/dL
RBC / HPF: NONE SEEN RBC/hpf (ref 0–5)
SPECIFIC GRAVITY, URINE: 1.006 (ref 1.005–1.030)
pH: 7 (ref 5.0–8.0)

## 2017-01-16 LAB — CBC
HCT: 34.9 % — ABNORMAL LOW (ref 35.0–47.0)
Hemoglobin: 11.9 g/dL — ABNORMAL LOW (ref 12.0–16.0)
MCH: 29.6 pg (ref 26.0–34.0)
MCHC: 34.1 g/dL (ref 32.0–36.0)
MCV: 86.8 fL (ref 80.0–100.0)
PLATELETS: 344 10*3/uL (ref 150–440)
RBC: 4.02 MIL/uL (ref 3.80–5.20)
RDW: 15.4 % — ABNORMAL HIGH (ref 11.5–14.5)
WBC: 6 10*3/uL (ref 3.6–11.0)

## 2017-01-16 LAB — LIPASE, BLOOD: LIPASE: 23 U/L (ref 11–51)

## 2017-01-16 NOTE — ED Notes (Signed)
Charge RN notified that patient should be moved to major side.

## 2017-01-16 NOTE — ED Notes (Signed)

## 2017-01-16 NOTE — ED Triage Notes (Signed)
Pt to ed with c/o mid thoracic back pain x 4 days.  Denies injury.

## 2017-01-16 NOTE — Discharge Instructions (Signed)
Your workup in the Emergency Department today was reassuring.  We did not find any specific abnormalities.  We recommend you drink plenty of fluids, take your regular medications and/or any new ones prescribed today, and follow up with the doctor(s) listed in these documents as recommended.  As we discussed, we suggested obtaining plain x-rays of your thoracic spine to look for any abnormalities that could cause the distribution of pain you have been experiencing for such a long time (such as a low-grade infection in your vertebra, called osteomyelitis/discitis).  You stated repeatedly that you do not want to stay today for those x-rays.  We encourage you to at least discuss this possibility with Dr. Greggory Stallion, though, so that outpatient imaging can be ordered if your doctor feels it is appropriate.  Return to the Emergency Department if you develop new or worsening symptoms that concern you.

## 2017-01-16 NOTE — ED Provider Notes (Signed)
[facility] Emergency Department Provider Note  ____________________________________________   First MD Initiated Contact with Patient 01/16/17 1553     (approximate)  I have reviewed the triage vital signs and the nursing notes.   HISTORY  Chief Complaint Abdominal Pain    HPI Kari Mueller is a 46 y.o. female with medical and psychiatric history as listed below who presents for evaluation of pain that radiates from the left upper part of her abdomen around her back and around to the right side.  Initially she said it has been going on for 2 or more weeks, but then after we discussed that some more she states that she spoke with her primary care doctor, Dr. Greggory Stallion, about this issue sometime last year.  She reports that it is no better and no worse.  It comes and goes but seems to be worse at night when she lies down to go to sleep.  She states that when she lies down to go to sleep (and she elevates on pillows due to acid reflux), she also feels not only the pain in her sides and back but also she develops too much saliva in her mouth and she feels like she is going to choke.  She states that she then passes out, but this only happens when she is lying down to go to sleep.  As described above, this seems to have been going on for at least a year.  She denies central back pain but states the pain is a burning sensation that wraps around her body starting on the left upper part of her abdomen.  Nothing makes it better and nothing makes it worse.  She states that she tried omeprazole and ranitidine at the recommendation of her primary care doctor last year but did not feel like it was helping so she stopped taking it.  She has had no injuries or trauma.  She denies having any rash or even redness in the area.  It is not particularly sore to the touch.  She denies fever/chills, chest pain, shortness of breath, nausea, vomiting, abdominal pain.  She states she has been  compliant with her medications including her clozapine.  She has not seen her primary care doctor since last year.   Past Medical History:  Diagnosis Date  . GERD (gastroesophageal reflux disease)   . Obesity   . Schizophrenia (HCC)     There are no active problems to display for this patient.   History reviewed. No pertinent surgical history.  Prior to Admission medications   Medication Sig Start Date End Date Taking? Authorizing Provider  cloZAPine (CLOZARIL) 100 MG tablet Take 100 mg by mouth 2 (two) times daily. 01/11/17  Yes [provider]  carbamazepine (TEGRETOL) 100 MG chewable tablet Chew 100 mg by mouth 2 (two) times daily.    [provider]  famotidine (PEPCID) 20 MG tablet Take 20 mg by mouth 2 (two) times daily.    [provider]  hydrocortisone (ANUSOL-HC) 2.5 % rectal cream Place 1 application rectally 2 (two) times daily. 12/04/14   Emily Filbert, MD  lidocaine (XYLOCAINE) 2 % solution Use as directed 10 mLs in the mouth or throat as needed for mouth pain. 10/31/16   Enid Derry, PA-C  pantoprazole (PROTONIX) 20 MG tablet Take 20 mg by mouth 2 (two) times daily.    [provider]  predniSONE (STERAPRED UNI-PAK 21 TAB) 10 MG (21) TBPK tablet Take 6 tablets on day  1, take 5 tablets on day 2, take 4 tablets on day 3, take 3 tablets on day 4, take 2 tablets on day 5, take 1 tablet on day 6 10/31/16   Enid Derry, PA-C    Allergies Patient has no known allergies.  History reviewed. No pertinent family history.  Social History Social History  Substance Use Topics  . Smoking status: Never Smoker  . Smokeless tobacco: Never Used  . Alcohol use No    Review of Systems Constitutional: No fever/chills Eyes: No visual changes. ENT: No sore throat. Cardiovascular: Denies chest pain. Respiratory: Denies shortness of breath. Gastrointestinal: No abdominal pain.  No nausea, no vomiting.  No diarrhea.  No constipation. She  reports a band of burning pain that radiates from her left upper quadrant around her side and back and over to the right.  Also reports that when she lies down to go to sleep she develops too much saliva in her mouth and then sometimes passes out. Genitourinary: Negative for dysuria. Musculoskeletal: Negative for neck pain.  Band of burning pain radiating from her left upper quadrant around to her right side, no specific spinal/vertebral back pain Integumentary: Negative for rash. Neurological: Negative for headaches, focal weakness or numbness.   ____________________________________________   PHYSICAL EXAM:  VITAL SIGNS: ED Triage Vitals  Enc Vitals Group     BP --      Pulse Rate 01/16/17 1415 79     Resp 01/16/17 1415 18     Temp 01/16/17 1415 98.4 F (36.9 C)     Temp Source 01/16/17 1415 Oral     SpO2 01/16/17 1415 100 %     Weight 01/16/17 1415 (!) 147.4 kg (325 lb)     Height 01/16/17 1415 1.702 m (5\' 7" )     Head Circumference --      Peak Flow --      Pain Score 01/16/17 1414 8     Pain Loc --      Pain Edu? --      Excl. in [place]? --     Constitutional: Alert and oriented. Well appearing and in no acute distress. Eyes: Conjunctivae are normal.  Head: Atraumatic. Mouth/Throat: Mucous membranes are moist.  Oropharynx non-erythematous. Neck: No stridor.  No meningeal signs.  No cervical spine tenderness to palpation. Cardiovascular: Normal rate, regular rhythm. Good peripheral circulation. Grossly normal heart sounds. Respiratory: Normal respiratory effort.  No retractions. Lungs CTAB. Gastrointestinal: Obese.  Soft and nontender. No distention.  Musculoskeletal: No lower extremity tenderness nor edema. No gross deformities of extremities.  She has no tenderness to palpation along the full length of her spine with no step-offs and no deformities.  There is no fluctuance or induration or erythema to suggest any infectious process. Neurologic:  Normal speech and language.  No gross focal neurologic deficits are appreciated.  Skin:  Skin is warm, dry and intact. No rash noted including no erythema nor vesicular lesions Psychiatric: Mood and affect are normal. Speech and behavior are normal.  ____________________________________________   LABS (all labs ordered are listed, but only abnormal results are displayed)  Labs Reviewed  CBC - Abnormal; Notable for the following:       Result Value   Hemoglobin 11.9 (*)    HCT 34.9 (*)    RDW 15.4 (*)    All other components within normal limits  URINALYSIS, COMPLETE (UACMP) WITH MICROSCOPIC - Abnormal; Notable for the following:    Color, Urine YELLOW (*)  APPearance HAZY (*)    Leukocytes, UA SMALL (*)    Squamous Epithelial / LPF 6-30 (*)    All other components within normal limits  LIPASE, BLOOD  COMPREHENSIVE METABOLIC PANEL   ____________________________________________  EKG  ED ECG REPORT I, Kinnick Maus, the attending physician, personally viewed and interpreted this ECG.  Date: 01/16/2017 EKG Time: 14:58 Rate: 72. Rhythm: normal sinus rhythm QRS Axis: normal Intervals: normal ST/T Wave abnormalities: Inverted T waves in lead 3, otherwise unremarkable Narrative Interpretation: no evidence of acute ischemia  ____________________________________________  RADIOLOGY   No results found.  ____________________________________________   PROCEDURES  Critical Care performed: No   Procedure(s) performed:   Procedures   ____________________________________________   INITIAL IMPRESSION / ASSESSMENT AND PLAN / ED COURSE  Pertinent labs & imaging results that were available during my care of the patient were reviewed by me and considered in my medical decision making (see chart for details).  I can find no acute or emergent medical reason to explain the patient's long-term symptoms of a burning sensation  As described above.  Initially I was wondering about the possibility of  zoster that has not yet erupted, but I find this a lot less likely since it apparently has been going on for almost or possibly even more than a year.  Her vital signs are normal and her lab results are unremarkable and reassuring.  She has no focal neurological deficits.  I do not feel that abdominal or thoracic imaging would be helpful in identifying the cause of this chronic issue.  I did spend a fair amount of time explaining to the patient about why I felt it would be reasonable to obtain plain radiographs of her thoracic spine; if she had a process such as a mild chronic osteomyelitis/discitis, that could in theory explain the  Dermatomal distribution of pain.  Although my level of suspicion is for too low to warrant an MRI, I strongly encouraged her to allow [place] to get radiographs of the thoracic spine which would be a good screening exam, and if there were abnormalities seen on the plain films we could proceed with advanced imaging.  However she is refusing the imaging and stating that she would rather just go home.  I explained multiple times what that is a good idea but she does not want to stay for the radiographs.  I will mention my thought process and discharge paperwork so that she can follow-up with her primary care doctor and they can consider whether outpatient imaging is appropriate.  I gave my usual and customary return precautions.     Clinical Course as of Jan 16 2030  Sat Jan 16, 2017  1555 I reviewed the patient's prescription history over the last 12 months in the multi-state controlled substances database(s) that includes [place], [place], [place], [place], [place], [place], [place], [place], [place], [place], [place], [place], [place], and [place].  Results were notable for only 1 prescription for narcotics which was filled almost exactly a year ago.  [CF]    Clinical Course User Index [CF] Loleta Rose, MD     ____________________________________________  FINAL CLINICAL IMPRESSION(S) / ED DIAGNOSES  Final diagnoses:  LUQ pain     MEDICATIONS GIVEN DURING THIS VISIT:  Medications - No data to display   NEW OUTPATIENT MEDICATIONS STARTED DURING THIS VISIT:  Discharge Medication List as of 01/16/2017  4:22 PM      Discharge Medication List as of 01/16/2017  4:22 PM  Discharge Medication List as of 01/16/2017  4:22 PM       Note:  This document was prepared using Dragon voice recognition software and may include unintentional dictation errors.    Loleta Rose, MD 01/16/17 2031

## 2021-06-13 ENCOUNTER — Other Ambulatory Visit: Payer: Self-pay

## 2021-06-13 ENCOUNTER — Encounter: Payer: Self-pay | Admitting: Emergency Medicine

## 2021-06-13 ENCOUNTER — Ambulatory Visit
Admission: EM | Admit: 2021-06-13 | Discharge: 2021-06-13 | Disposition: A | Discharge status: Home / Self Care | Payer: BC Managed Care – PPO | Attending: Medical Oncology | Admitting: Medical Oncology

## 2021-06-13 DIAGNOSIS — M722 Plantar fascial fibromatosis: Secondary | ICD-10-CM

## 2021-06-13 MED ORDER — MELOXICAM 15 MG PO TABS: 15.0000 mg | ORAL_TABLET | Freq: Every day | ORAL | 0 refills | Status: DC

## 2021-06-13 NOTE — ED Provider Notes (Signed)
[facility]    CSN: [id number] Arrival date & time: 06/13/21  0820      History   Chief Complaint Chief Complaint  Patient presents with   Foot Pain    bilateral    HPI Kari Mueller is a 51 y.o. female.   HPI  Foot Pain: Patient reports that for the past 3 weeks she has had pain in the arches of her feet. Worse when she first gets up to walk. Feels like a sharp pain in the arches of her feet. She denies any injury or falls. She has tried insoles for symptoms but she thinks this may have made things worse.    Past Medical History:  Diagnosis Date   GERD (gastroesophageal reflux disease)    Obesity    Schizophrenia ([place])     There are no problems to display for this patient.   History reviewed. No pertinent surgical history.  OB History   No obstetric history on file.      Home Medications    Prior to Admission medications   Medication Sig Start Date End Date Taking? Authorizing Provider  cloZAPine (CLOZARIL) 100 MG tablet Take 100 mg by mouth 2 (two) times daily. 01/11/17  Yes [provider]  carbamazepine (TEGRETOL) 100 MG chewable tablet Chew 100 mg by mouth 2 (two) times daily.    [provider]  famotidine (PEPCID) 20 MG tablet Take 20 mg by mouth 2 (two) times daily.    [provider]  hydrocortisone (ANUSOL-HC) 2.5 % rectal cream Place 1 application rectally 2 (two) times daily. 12/04/14   Earleen Newport, MD  lidocaine (XYLOCAINE) 2 % solution Use as directed 10 mLs in the mouth or throat as needed for mouth pain. 10/31/16   Laban Emperor, PA-C  pantoprazole (PROTONIX) 20 MG tablet Take 20 mg by mouth 2 (two) times daily.    [provider]  predniSONE (STERAPRED UNI-PAK 21 TAB) 10 MG (21) TBPK tablet Take 6 tablets on day 1, take 5 tablets on day 2, take 4 tablets on day 3, take 3 tablets on day 4, take 2 tablets on day 5, take 1 tablet on day 6 10/31/16   Laban Emperor, PA-C    Family  History History reviewed. No pertinent family history.  Social History Social History   Tobacco Use   Smoking status: Never   Smokeless tobacco: Never  Vaping Use   Vaping Use: Never used  Substance Use Topics   Alcohol use: No   Drug use: No     Allergies   Patient has no known allergies.   Review of Systems Review of Systems  As stated above in HPI Physical Exam Triage Vital Signs ED Triage Vitals  Enc Vitals Group     BP 06/13/21 0836 137/86     Pulse Rate 06/13/21 0836 96     Resp 06/13/21 0836 15     Temp 06/13/21 0836 98.3 F (36.8 C)     Temp Source 06/13/21 0836 Oral     SpO2 06/13/21 0836 97 %     Weight 06/13/21 0833 (!) 327 lb (148.3 kg)     Height 06/13/21 0833 5\' 7"  (1.702 m)     Head Circumference --      Peak Flow --      Pain Score 06/13/21 0833 8     Pain Loc --      Pain Edu? --      Excl. in [place]? --  No data found.  Updated Vital Signs BP 137/86 (BP Location: Left Arm)    Pulse 96    Temp 98.3 F (36.8 C) (Oral)    Resp 15    Ht 5\' 7"  (1.702 m)    Wt (!) 327 lb (148.3 kg)    LMP 01/05/2017    SpO2 97%    BMI 51.22 kg/m   Physical Exam Vitals and nursing note reviewed.  Constitutional:      Appearance: Normal appearance.  Cardiovascular:     Pulses: Normal pulses.  Musculoskeletal:        General: No swelling or deformity. Normal range of motion.     Right lower leg: No edema.     Left lower leg: No edema.     Comments: Flat feet bilaterally. There is reproducible tenderness of bilateral plantar fascia. No other abnormality noted.    Skin:    General: Skin is warm.     Capillary Refill: Capillary refill takes less than 2 seconds.     Coloration: Skin is not pale.     Findings: No bruising, erythema or rash.  Neurological:     Mental Status: She is alert and oriented to person, place, and time.     UC Treatments / Results  Labs (all labs ordered are listed, but only abnormal results are displayed) Labs Reviewed - No data to  display  EKG   Radiology No results found.  Procedures Procedures (including critical care time)  Medications Ordered in UC Medications - No data to display  Initial Impression / Assessment and Plan / UC Course  I have reviewed the triage vital signs and the nursing notes.  Pertinent labs & imaging results that were available during my care of the patient were reviewed by me and considered in my medical decision making (see chart for details).     New.  Appears to be plantar fasciitis which I discussed with patient.  We discussed completing ankle rolls to help stretch the tendon before she starts walking which may help a bit with her pain.  Since the insoles have seemingly made things worse she can go back to her regular insoles.  For now we discussed an NSAID that would be beneficial for pain and inflammation along with ice water bottle rolls and stretches.  We did discuss how to use this medication along, potential side effects and precautions.  Should symptoms fail to improve I did recommend a podiatrist.  Final Clinical Impressions(s) / UC Diagnoses   Final diagnoses:  None   Discharge Instructions   None    ED Prescriptions   None    PDMP not reviewed this encounter.   Hughie Closs, [place] 06/13/21 0900

## 2021-06-13 NOTE — ED Triage Notes (Signed)
Patient c/o bilateral foot pain that started 3 weeks ago.  Patient reports pain in the arch of her feet.  Patient denies injury or falls.

## 2021-06-23 ENCOUNTER — Other Ambulatory Visit: Payer: Self-pay

## 2021-06-23 ENCOUNTER — Encounter: Payer: Self-pay | Admitting: Emergency Medicine

## 2021-06-23 ENCOUNTER — Ambulatory Visit
Admission: EM | Admit: 2021-06-23 | Discharge: 2021-06-23 | Disposition: A | Discharge status: Home / Self Care | Payer: BC Managed Care – PPO | Attending: Emergency Medicine | Admitting: Emergency Medicine

## 2021-06-23 DIAGNOSIS — R1012 Left upper quadrant pain: Secondary | ICD-10-CM | POA: Insufficient documentation

## 2021-06-23 LAB — COMPREHENSIVE METABOLIC PANEL
ALT: 26 U/L (ref 0–44)
AST: 20 U/L (ref 15–41)
Albumin: 4.1 g/dL (ref 3.5–5.0)
Alkaline Phosphatase: 96 U/L (ref 38–126)
Anion gap: 9 (ref 5–15)
BUN: 15 mg/dL (ref 6–20)
CO2: 27 mmol/L (ref 22–32)
Calcium: 9.2 mg/dL (ref 8.9–10.3)
Chloride: 104 mmol/L (ref 98–111)
Creatinine, Ser: 0.92 mg/dL (ref 0.44–1.00)
GFR, Estimated: >60 mL/min (ref >60)
Glucose, Bld: 92 mg/dL (ref 70–99)
Potassium: 4.1 mmol/L (ref 3.5–5.1)
Sodium: 140 mmol/L (ref 135–145)
Total Bilirubin: 0.5 mg/dL (ref 0.3–1.2)
Total Protein: 8.2 g/dL — ABNORMAL HIGH (ref 6.5–8.1)

## 2021-06-23 LAB — CBC WITH DIFFERENTIAL/PLATELET
Abs Immature Granulocytes: 0.05 10*3/uL (ref 0.00–0.07)
Basophils Absolute: 0.1 10*3/uL (ref 0.0–0.1)
Basophils Relative: 1 %
Eosinophils Absolute: 0.1 10*3/uL (ref 0.0–0.5)
Eosinophils Relative: 1 %
HCT: 41.2 % (ref 36.0–46.0)
Hemoglobin: 13.2 g/dL (ref 12.0–15.0)
Immature Granulocytes: 1 %
Lymphocytes Relative: 39 %
Lymphs Abs: 3.3 10*3/uL (ref 0.7–4.0)
MCH: 28.4 pg (ref 26.0–34.0)
MCHC: 32 g/dL (ref 30.0–36.0)
MCV: 88.6 fL (ref 80.0–100.0)
Monocytes Absolute: 0.5 10*3/uL (ref 0.1–1.0)
Monocytes Relative: 6 %
Neutro Abs: 4.5 10*3/uL (ref 1.7–7.7)
Neutrophils Relative %: 52 %
Platelets: 328 10*3/uL (ref 150–400)
RBC: 4.65 MIL/uL (ref 3.87–5.11)
RDW: 14.6 % (ref 11.5–15.5)
WBC: 8.4 10*3/uL (ref 4.0–10.5)
nRBC: 0 % (ref 0.0–0.2)

## 2021-06-23 NOTE — ED Triage Notes (Signed)
Pt c/o left sided rib pain. Started about a year ago but has gotten worse. Nothing makes pain better or worse.

## 2021-06-23 NOTE — Discharge Instructions (Signed)
There was a firmness felt in the left upper quadrant of your abdomen that is of concern  We are working on getting you a CT scan, we will call you at some point tomorrow morning to tell you what time to come get imaging done, once we get the results of the CT scan you will be notified on how to move forward  We have completed lab work today to assess your electrolytes, kidney, liver and blood cells, you will be notified of any concerning values

## 2021-06-23 NOTE — ED Provider Notes (Signed)
[facility]    CSN: [id number] Arrival date & time: 06/23/21  1616      History   Chief Complaint Chief Complaint  Patient presents with   rib cage pain    left    HPI Kari Mueller is a 51 y.o. female.   Patient presents with left-sided rib pain described as a burning sensation occurring intermittently for 1 year .  Denies precipitating event, trauma or injury.  Symptoms began and resolved abruptly requiring no treatment.  Independent of movement .  Pain does not radiate .  Denies nausea, vomiting, diarrhea, heartburn or indigestion, increased bloating, increased gas production, urinary symptoms, vaginal symptoms, fever, chills, URI symptoms, changes to diet, recent travel.  Endorses that she was seen in the emergency department for similar symptoms.     Past Medical History:  Diagnosis Date   GERD (gastroesophageal reflux disease)    Obesity    Schizophrenia ([place])     There are no problems to display for this patient.   History reviewed. No pertinent surgical history.  OB History   No obstetric history on file.      Home Medications    Prior to Admission medications   Medication Sig Start Date End Date Taking? Authorizing Provider  carbamazepine (TEGRETOL) 100 MG chewable tablet Chew 100 mg by mouth 2 (two) times daily.   Yes [provider]  cloZAPine (CLOZARIL) 100 MG tablet Take 100 mg by mouth 2 (two) times daily. 01/11/17  Yes [provider]  meloxicam (MOBIC) 15 MG tablet Take 1 tablet (15 mg total) by mouth daily. 06/13/21  Yes C[place]ton, Sarah M, PA-C  famotidine (PEPCID) 20 MG tablet Take 20 mg by mouth 2 (two) times daily.    [provider]  hydrocortisone (ANUSOL-HC) 2.5 % rectal cream Place 1 application rectally 2 (two) times daily. 12/04/14   Earleen Newport, MD  lidocaine (XYLOCAINE) 2 % solution Use as directed 10 mLs in the mouth or throat as needed for mouth pain. 10/31/16   Laban Emperor, PA-C   pantoprazole (PROTONIX) 20 MG tablet Take 20 mg by mouth 2 (two) times daily.    [provider]  predniSONE (STERAPRED UNI-PAK 21 TAB) 10 MG (21) TBPK tablet Take 6 tablets on day 1, take 5 tablets on day 2, take 4 tablets on day 3, take 3 tablets on day 4, take 2 tablets on day 5, take 1 tablet on day 6 10/31/16   Laban Emperor, PA-C    Family History No family history on file.  Social History Social History   Tobacco Use   Smoking status: Never   Smokeless tobacco: Never  Vaping Use   Vaping Use: Never used  Substance Use Topics   Alcohol use: No   Drug use: No     Allergies   Patient has no known allergies.   Review of Systems Review of Systems  Constitutional: Negative.   Respiratory: Negative.    Gastrointestinal:  Positive for abdominal pain. Negative for abdominal distention, anal bleeding, blood in stool, constipation, diarrhea, nausea, rectal pain and vomiting.  Skin: Negative.   Neurological: Negative.     Physical Exam Triage Vital Signs ED Triage Vitals  Enc Vitals Group     BP 06/23/21 1626 (!) 152/96     Pulse Rate 06/23/21 1626 89     Resp 06/23/21 1626 18     Temp 06/23/21 1626 98.7 F (37.1 C)     Temp Source 06/23/21 1626  Oral     SpO2 06/23/21 1626 97 %     Weight 06/23/21 1624 (!) 326 lb 15.1 oz (148.3 kg)     Height 06/23/21 1624 5\' 7"  (1.702 m)     Head Circumference --      Peak Flow --      Pain Score 06/23/21 1623 7     Pain Loc --      Pain Edu? --      Excl. in [place]? --    No data found.  Updated Vital Signs BP (!) 152/96 (BP Location: Left Arm)    Pulse 89    Temp 98.7 F (37.1 C) (Oral)    Resp 18    Ht 5\' 7"  (1.702 m)    Wt (!) 326 lb 15.1 oz (148.3 kg)    LMP 01/05/2017    SpO2 97%    BMI 51.21 kg/m   Visual Acuity Right Eye Distance:   Left Eye Distance:   Bilateral Distance:    Right Eye Near:   Left Eye Near:    Bilateral Near:     Physical Exam Constitutional:      Appearance: Normal appearance.   HENT:     Head: Normocephalic.  Eyes:     Extraocular Movements: Extraocular movements intact.  Pulmonary:     Effort: Pulmonary effort is normal.  Abdominal:     General: Abdomen is flat. Bowel sounds are normal.     Tenderness: There is abdominal tenderness in the left upper quadrant.     Hernia: No hernia is present.     Comments:  splenomegaly extending two fingerbreadths beyond the rib cage   Skin:    General: Skin is warm and dry.  Neurological:     Mental Status: She is alert and oriented to person, place, and time. Mental status is at baseline.  Psychiatric:        Mood and Affect: Mood normal.        Behavior: Behavior normal.     UC Treatments / Results  Labs (all labs ordered are listed, but only abnormal results are displayed) Labs Reviewed - No data to display  EKG   Radiology No results found.  Procedures Procedures (including critical care time)  Medications Ordered in UC Medications - No data to display  Initial Impression / Assessment and Plan / UC Course  I have reviewed the triage vital signs and the nursing notes.  Pertinent labs & imaging results that were available during my care of the patient were reviewed by me and considered in my medical decision making (see chart for details).  Left upper quadrant pain  Overall symptoms generally vague and has been present for at least 1 year, vital signs are stable however concerned with palpable mass of unknown etiology on exam, unable to obtain CT of the abdomen tonight, waiting for insurance clearance, will attempt to get patient on schedule for tomorrow, CBC and CMP pending, will notify patient of any concerning values, patient verbalized understanding of plan of care Final Clinical Impressions(s) / UC Diagnoses   Final diagnoses:  None   Discharge Instructions   None    ED Prescriptions   None    PDMP not reviewed this encounter.   Hans Eden, NP 06/24/21 1301

## 2021-06-24 ENCOUNTER — Ambulatory Visit: Payer: BC Managed Care – PPO | Attending: Emergency Medicine

## 2021-06-24 ENCOUNTER — Telehealth: Payer: Self-pay | Admitting: Emergency Medicine

## 2021-06-24 NOTE — Telephone Encounter (Addendum)
Prior auth approved for Stat Abdominal CT Auth # [id number].   Pt to come back to [facility] for CT. Tried to call pt, LM to return call.

## 2021-06-24 NOTE — Telephone Encounter (Signed)
Received insurance approval to order

## 2021-06-24 NOTE — ED Notes (Signed)
Abdominal CT approved Auth # [id number]

## 2022-08-30 ENCOUNTER — Ambulatory Visit
Admission: EM | Admit: 2022-08-30 | Discharge: 2022-08-30 | Disposition: A | Discharge status: Home / Self Care | Payer: BC Managed Care – PPO | Attending: Family Medicine | Admitting: Family Medicine

## 2022-08-30 DIAGNOSIS — M79604 Pain in right leg: Secondary | ICD-10-CM | POA: Diagnosis not present

## 2022-08-30 DIAGNOSIS — M79605 Pain in left leg: Secondary | ICD-10-CM | POA: Diagnosis not present

## 2022-08-30 MED ORDER — MELOXICAM 15 MG PO TABS: 15.0000 mg | ORAL_TABLET | Freq: Every day | ORAL | 0 refills | Status: AC

## 2022-08-30 NOTE — ED Provider Notes (Signed)
[facility]    CSN: [id number] Arrival date & time: 08/30/22  1218      History   Chief Complaint Chief Complaint  Patient presents with   Leg Pain    "Pain and cramps in legs with occasional stiffness". No injury known. No other acute problems.     HPI Kari Mueller is a 52 y.o. female.   HPI   Kari Mueller presents for cramps in feet, thighs and calves for the past 2 months. She has flat feet. She came into today as the pain starting to shoot into her calf.  She is out of Meloxican which typically helps. Requests refill. No fever, history of PE/DVT. Endorse left knee pain but this is not new.       Past Medical History:  Diagnosis Date   GERD (gastroesophageal reflux disease)    Obesity    Schizophrenia     There are no problems to display for this patient.   History reviewed. No pertinent surgical history.  OB History   No obstetric history on file.      Home Medications    Prior to Admission medications   Medication Sig Start Date End Date Taking? Authorizing Provider  carbamazepine (TEGRETOL) 100 MG chewable tablet Chew 100 mg by mouth 2 (two) times daily.   Yes [provider]  cloZAPine (CLOZARIL) 100 MG tablet Take 100 mg by mouth 2 (two) times daily. 01/11/17  Yes [provider]  cloZAPine (CLOZARIL) 100 MG tablet Take by mouth. 05/31/22 05/26/23 Yes [provider]  cloZAPine (CLOZARIL) 100 MG tablet Take by mouth. 10/19/12  Yes [provider]  famotidine (PEPCID) 20 MG tablet Take 20 mg by mouth 2 (two) times daily.    [provider]  hydrocortisone (ANUSOL-HC) 2.5 % rectal cream Place 1 application rectally 2 (two) times daily. 12/04/14   Emily Filbert, MD  lidocaine (XYLOCAINE) 2 % solution Use as directed 10 mLs in the mouth or throat as needed for mouth pain. 10/31/16   Enid Derry, PA-C  meloxicam (MOBIC) 15 MG tablet Take 1 tablet (15 mg total) by mouth daily. 08/30/22   Chanie Soucek,  Seward Meth, DO  pantoprazole (PROTONIX) 20 MG tablet Take 20 mg by mouth 2 (two) times daily.    [provider]  predniSONE (STERAPRED UNI-PAK 21 TAB) 10 MG (21) TBPK tablet Take 6 tablets on day 1, take 5 tablets on day 2, take 4 tablets on day 3, take 3 tablets on day 4, take 2 tablets on day 5, take 1 tablet on day 6 10/31/16   Enid Derry, PA-C    Family History History reviewed. No pertinent family history.  Social History Social History   Tobacco Use   Smoking status: Never   Smokeless tobacco: Never  Vaping Use   Vaping Use: Never used  Substance Use Topics   Alcohol use: No   Drug use: No     Allergies   Patient has no known allergies.   Review of Systems Review of Systems: negative unless otherwise stated in HPI.      Physical Exam Triage Vital Signs ED Triage Vitals  Enc Vitals Group     BP 08/30/22 1327 126/87     Pulse Rate 08/30/22 1327 87     Resp 08/30/22 1327 20     Temp 08/30/22 1327 98.5 F (36.9 C)     Temp Source 08/30/22 1327 Oral     SpO2 08/30/22 1327 97 %  Weight 08/30/22 1324 (!) 320 lb (145.2 kg)     Height 08/30/22 1324 5\' 6"  (1.676 m)     Head Circumference --      Peak Flow --      Pain Score 08/30/22 1323 7     Pain Loc --      Pain Edu? --      Excl. in GC? --    No data found.  Updated Vital Signs BP 126/87 (BP Location: Right Arm)   Pulse 87   Temp 98.5 F (36.9 C) (Oral)   Resp 20   Ht 5\' 6"  (1.676 m)   Wt (!) 145.2 kg   LMP 01/05/2017   SpO2 97%   BMI 51.65 kg/m   Visual Acuity Right Eye Distance:   Left Eye Distance:   Bilateral Distance:    Right Eye Near:   Left Eye Near:    Bilateral Near:     Physical Exam GEN:     alert, pleasant female in no distress EYES:   pupils equal and reactive RESP:  clear to auscultation bilaterally CVS:   regular rate, distal pulses intact   EXT:  Baseline range of motion of bilateral knees and hips, no edema, no deformity or trauma to the lower extremities,  gross sensation intact, strength 5/5 bilateral lower extremities, no calf tenderness Skin:   warm and dry, no rash, normal skin turgor Psych: Normal affect, appropriate speech and behavior      UC Treatments / Results  Labs (all labs ordered are listed, but only abnormal results are displayed) Labs Reviewed - No data to display  EKG  If EKG performed, see my interpretation in the MDM section  Radiology No results found.   Procedures Procedures (including critical care time)  Medications Ordered in UC Medications - No data to display  Initial Impression / Assessment and Plan / UC Course  I have reviewed the triage vital signs and the nursing notes.  Pertinent labs & imaging results that were available during my care of the patient were reviewed by me and considered in my medical decision making (see chart for details).       Patient is a 52 y.o. female  who presents for 2 months of bilateral leg pain..  Overall patient is well appearing and afebrile.  Vital signs stable.  She has history of chronic leg pain.  She was previously on meloxicam for this but she has since been out.  As meloxicam resolved her pain we will refill this today.  She will follow-up with her primary care provider or orthopedic provider if pain does not resolve.  ED precautions given and she voiced understanding.   Discussed MDM, treatment plan and plan for follow-up with patient who agrees with plan.    Final Clinical Impressions(s) / UC Diagnoses   Final diagnoses:  Bilateral leg pain     Discharge Instructions      Stop by the pharmacy to pick up your prescriptions.  Follow up with your primary care provider as needed.      ED Prescriptions     Medication Sig Dispense Auth. Provider   meloxicam (MOBIC) 15 MG tablet Take 1 tablet (15 mg total) by mouth daily. 30 tablet Katha Cabal, DO      PDMP not reviewed this encounter.   Katha Cabal, DO 08/30/22 1429

## 2022-08-30 NOTE — Discharge Instructions (Signed)
Stop by the pharmacy to pick up your prescriptions.  Follow up with your primary care provider as needed.  

## 2023-02-17 LAB — COLOGUARD

## 2023-03-14 LAB — COLOGUARD: COLOGUARD: NEGATIVE

## 2024-02-22 ENCOUNTER — Emergency Department (HOSPITAL_COMMUNITY)

## 2024-02-22 ENCOUNTER — Other Ambulatory Visit: Payer: Self-pay

## 2024-02-22 ENCOUNTER — Emergency Department: Admission: EM | Admit: 2024-02-22 | Discharge: 2024-02-22 | Disposition: A | Discharge status: Home / Self Care

## 2024-02-22 ENCOUNTER — Emergency Department

## 2024-02-22 ENCOUNTER — Encounter: Payer: Self-pay | Admitting: Emergency Medicine

## 2024-02-22 DIAGNOSIS — I10 Essential (primary) hypertension: Secondary | ICD-10-CM | POA: Insufficient documentation

## 2024-02-22 DIAGNOSIS — R600 Localized edema: Secondary | ICD-10-CM | POA: Insufficient documentation

## 2024-02-22 DIAGNOSIS — R1013 Epigastric pain: Secondary | ICD-10-CM | POA: Diagnosis present

## 2024-02-22 DIAGNOSIS — R1012 Left upper quadrant pain: Secondary | ICD-10-CM | POA: Diagnosis not present

## 2024-02-22 HISTORY — DX: Essential (primary) hypertension: I10

## 2024-02-22 LAB — CBC
HCT: 38.2 % (ref 36.0–46.0)
Hemoglobin: 12.2 g/dL (ref 12.0–15.0)
MCH: 28.8 pg (ref 26.0–34.0)
MCHC: 31.9 g/dL (ref 30.0–36.0)
MCV: 90.1 fL (ref 80.0–100.0)
Platelets: 304 K/uL (ref 150–400)
RBC: 4.24 MIL/uL (ref 3.87–5.11)
RDW: 14.3 % (ref 11.5–15.5)
WBC: 6.5 K/uL (ref 4.0–10.5)
nRBC: 0 % (ref 0.0–0.2)

## 2024-02-22 LAB — COMPREHENSIVE METABOLIC PANEL WITH GFR
ALT: 21 U/L (ref 0–44)
AST: 18 U/L (ref 15–41)
Albumin: 4 g/dL (ref 3.5–5.0)
Alkaline Phosphatase: 75 U/L (ref 38–126)
Anion gap: 11 (ref 5–15)
BUN: 12 mg/dL (ref 6–20)
CO2: 28 mmol/L (ref 22–32)
Calcium: 9.1 mg/dL (ref 8.9–10.3)
Chloride: 104 mmol/L (ref 98–111)
Creatinine, Ser: 0.83 mg/dL (ref 0.44–1.00)
GFR, Estimated: >60 mL/min (ref >60)
Glucose, Bld: 101 mg/dL — ABNORMAL HIGH (ref 70–99)
Potassium: 4.3 mmol/L (ref 3.5–5.1)
Sodium: 143 mmol/L (ref 135–145)
Total Bilirubin: 0.6 mg/dL (ref 0.0–1.2)
Total Protein: 7.6 g/dL (ref 6.5–8.1)

## 2024-02-22 LAB — URINALYSIS, ROUTINE W REFLEX MICROSCOPIC
Bilirubin Urine: NEGATIVE
Glucose, UA: NEGATIVE mg/dL
Hgb urine dipstick: NEGATIVE
Ketones, ur: NEGATIVE mg/dL
Nitrite: NEGATIVE
Protein, ur: NEGATIVE mg/dL
Specific Gravity, Urine: 1.004 — ABNORMAL LOW (ref 1.005–1.030)
pH: 7 (ref 5.0–8.0)

## 2024-02-22 LAB — BRAIN NATRIURETIC PEPTIDE: B Natriuretic Peptide: 2.6 pg/mL (ref 0.0–100.0)

## 2024-02-22 LAB — TROPONIN I (HIGH SENSITIVITY): Troponin I (High Sensitivity): <2 ng/L (ref <18)

## 2024-02-22 LAB — LIPASE, BLOOD: Lipase: 23 U/L (ref 11–51)

## 2024-02-22 MED ORDER — LIDOCAINE VISCOUS HCL 2 % MT SOLN
15.0000 mL | Freq: Once | OROMUCOSAL | Status: AC
  Administered 2024-02-22: 15 mL via ORAL
  Filled 2024-02-22: qty 15

## 2024-02-22 MED ORDER — ONDANSETRON HCL 4 MG/2ML IJ SOLN
4.0000 mg | Freq: Once | INTRAMUSCULAR | Status: AC
  Administered 2024-02-22: 4 mg via INTRAVENOUS
  Filled 2024-02-22: qty 2

## 2024-02-22 MED ORDER — FAMOTIDINE 20 MG PO TABS
20.0000 mg | ORAL_TABLET | Freq: Two times a day (BID) | ORAL | 0 refills | Status: AC
Start: 2024-02-22 — End: 2024-03-07

## 2024-02-22 MED ORDER — ACETAMINOPHEN 500 MG PO TABS
1000.0000 mg | ORAL_TABLET | Freq: Once | ORAL | Status: AC
  Administered 2024-02-22: 1000 mg via ORAL
  Filled 2024-02-22: qty 2

## 2024-02-22 MED ORDER — ALUMINUM-MAGNESIUM-SIMETHICONE 200-200-20 MG/5ML PO SUSP: 30.0000 mL | Freq: Three times a day (TID) | ORAL | 0 refills | Status: AC | PRN

## 2024-02-22 MED ORDER — ALUM & MAG HYDROXIDE-SIMETH 200-200-20 MG/5ML PO SUSP
30.0000 mL | Freq: Once | ORAL | Status: AC
  Administered 2024-02-22: 30 mL via ORAL
  Filled 2024-02-22: qty 30

## 2024-02-22 MED ORDER — FAMOTIDINE 20 MG PO TABS
20.0000 mg | ORAL_TABLET | Freq: Once | ORAL | Status: AC
  Administered 2024-02-22: 20 mg via ORAL
  Filled 2024-02-22: qty 1

## 2024-02-22 MED ORDER — IOHEXOL 300 MG/ML  SOLN
100.0000 mL | Freq: Once | INTRAMUSCULAR | Status: AC | PRN
  Administered 2024-02-22: 100 mL via INTRAVENOUS

## 2024-02-22 NOTE — ED Triage Notes (Signed)
 Patient to ED via POV for LUQ abd pain and bilateral leg swelling. States she has fatty liver disease and HTN.Symptoms ongoing x1 year- states getting worse.

## 2024-02-22 NOTE — Discharge Instructions (Addendum)
 Your evaluation in the emergency department is overall reassuring.  I suspect you have some irritation of the lining of your stomach, and I have prescribed some antacid medications to help with this.  Please do follow-up with your primary care provider for reevaluation, and return to the emergency department with any new or worsening symptoms.  Regarding your leg swelling, I recommend wearing compression stockings and elevating your legs when at rest.

## 2024-02-22 NOTE — ED Provider Notes (Signed)
 [facility] Provider Note    Event Date/Time   First MD Initiated Contact with Patient 02/22/24 1024     (approximate)   History   Abdominal Pain  Patient to ED via POV for LUQ abd pain and bilateral leg swelling. States she has fatty liver disease and HTN.Symptoms ongoing x1 year- states getting worse.    HPI Kari Mueller is a 53 y.o. female PMH schizophrenia, hypertension, GERD presents for evaluation of chronic abdominal pain and reported leg swelling - Patient is primarily here because she has had several days of worsening upper abdominal pain, both right and left upper quadrant as well as epigastrium.  Pain 8/10, refractory to Motrin .  Some nausea but no vomiting.  Last bowel movement today, unremarkable, no diarrhea, not black/bloody.  No fevers.  No abdominal surgical history.  Does note a history of fatty liver disease.   - No urinary symptoms - Separately notes that she has been having worsening bilateral lower extremity edema and does have a known history of lymphedema.  Says she feels short of breath when lying flat at night as well.       Physical Exam   Triage Vital Signs: ED Triage Vitals  Encounter Vitals Group     BP 02/22/24 1015 127/75     Girls Systolic BP Percentile --      Girls Diastolic BP Percentile --      Boys Systolic BP Percentile --      Boys Diastolic BP Percentile --      Pulse Rate 02/22/24 1015 87     Resp 02/22/24 1015 17     Temp 02/22/24 1015 98.1 F (36.7 C)     Temp Source 02/22/24 1015 Oral     SpO2 02/22/24 1015 100 %     Weight 02/22/24 1013 (!) 336 lb (152.4 kg)     Height 02/22/24 1013 5' 6 (1.676 m)     Head Circumference --      Peak Flow --      Pain Score 02/22/24 1013 8     Pain Loc --      Pain Education --      Exclude from Growth Chart --     Most recent vital signs: Vitals:   02/22/24 1015 02/22/24 1435  BP: 127/75 116/71  Pulse: 87 78  Resp: 17 17  Temp: 98.1 F (36.7 C) 98  F (36.7 C)  SpO2: 100% 99%     General: Awake, no distress.  CV:  Good peripheral perfusion. RRR, RP 2+ Resp:  Normal effort. CTAB Abd:  No distention.  Moderate tenderness in right upper quadrant/left upper quadrant/epigastrium.  No lower abdominal tenderness.  No CVA tenderness.    ED Results / Procedures / Treatments   Labs (all labs ordered are listed, but only abnormal results are displayed) Labs Reviewed  COMPREHENSIVE METABOLIC PANEL WITH GFR - Abnormal; Notable for the following components:      Result Value   Glucose, Bld 101 (*)    All other components within normal limits  URINALYSIS, ROUTINE W REFLEX MICROSCOPIC - Abnormal; Notable for the following components:   Color, Urine YELLOW (*)    APPearance HAZY (*)    Specific Gravity, Urine 1.004 (*)    Leukocytes,Ua MODERATE (*)    Bacteria, UA RARE (*)    All other components within normal limits  CBC  BRAIN NATRIURETIC PEPTIDE  LIPASE, BLOOD  TROPONIN I (HIGH SENSITIVITY)  EKG  Ecg = sinus rhythm, rate 92, no gross ST elevation or depression, no significant repolarization normality, normal axis, normal intervals.  No clear evidence of ischemia nor arrhythmia on my interpretation.   RADIOLOGY Radiology interpreted by myself and radiology report reviewed.  No acute pathology identified.    PROCEDURES:  Critical Care performed: No  Procedures   MEDICATIONS ORDERED IN ED: Medications  ondansetron (ZOFRAN) injection 4 mg (4 mg Intravenous Given 02/22/24 1213)  acetaminophen (TYLENOL) tablet 1,000 mg (1,000 mg Oral Given 02/22/24 1213)  alum & mag hydroxide-simeth (MAALOX/MYLANTA) 200-200-20 MG/5ML suspension 30 mL (30 mLs Oral Given 02/22/24 1213)    And  lidocaine  (XYLOCAINE ) 2 % viscous mouth solution 15 mL (15 mLs Oral Given 02/22/24 1213)  famotidine (PEPCID) tablet 20 mg (20 mg Oral Given 02/22/24 1213)  iohexol (OMNIPAQUE) 300 MG/ML solution 100 mL (100 mLs Intravenous Contrast Given 02/22/24  1243)     IMPRESSION / MDM / ASSESSMENT AND PLAN / ED COURSE  I reviewed the triage vital signs and the nursing notes.                              DDX/MDM/AP: Differential diagnosis includes, but is not limited to, gastritis, consider biliary pathology, pancreatitis, dyspepsia/PUD, considered but doubt anginal equivalent.  Also consider possibility of new onset heart failure given reported worsening bilateral lower extremity edema and orthopnea but suspect habitus is also contributing to this complaint, BMI greater than 50.  Do not clinically suspect DVT given bilateral nature.  Plan: - Labs - N.p.o. - EKG - Tylenol, GI cocktail, Zofran - Chest x-ray -  CT abdomen pelvis - reassess  Patient's presentation is most consistent with acute presentation with potential threat to life or bodily function.  The patient is on the cardiac monitor to evaluate for evidence of arrhythmia and/or significant heart rate changes.  ED course below.  Workup unremarkable, no evidence of acute intra-abdominal pathology and no clear evidence of heart failure.  Felt much better after GI cocktail.  Rx Maalox, famotidine.  Plan for PMD follow-up.  ED return precautions in place.  Patient agrees with plan.  Clinical Course as of 02/22/24 1456  Tue Feb 22, 2024  1039 Cbc wnl [MM]  1056 CMP reviewed, unremarkable [MM]  1252 Urinalysis contaminated, equivocal [MM]  1252 Chest x-ray interpreted by myself.  No acute pathology identified.  IMPRESSION: Mild cardiomegaly. No pneumonia or pulmonary edema.   [MM]  1332 CTAP: IMPRESSION: 1. Moderate size fat containing periumbilical hernia. 2. No other abnormality seen in the abdomen or pelvis.   [MM]  1407 Called lab bnp delayed --  [MM]  1439 BNP wnl [MM]  1449 Reevaluated, feeling much better, reassured by unremarkable workup.  Amenable to discharge, PMD follow-up.  Will Rx famotidine, Maalox, recommended Tylenol.  ED return precautions placed.  Patient  agrees with plan. [MM]    Clinical Course User Index [MM] Clarine Ozell LABOR, MD     FINAL CLINICAL IMPRESSION(S) / ED DIAGNOSES   Final diagnoses:  Epigastric abdominal pain  Bilateral leg edema     Rx / DC Orders   ED Discharge Orders          Ordered    famotidine (PEPCID) 20 MG tablet  2 times daily        02/22/24 1453    aluminum-magnesium hydroxide-simethicone (MAALOX) 200-200-20 MG/5ML SUSP  3 times daily PRN  02/22/24 1453             Note:  This document was prepared using Dragon voice recognition software and may include unintentional dictation errors.   Clarine Ozell LABOR, MD 02/22/24 [phone]

## 2024-02-22 NOTE — ED Notes (Signed)
 Patient transported to CT
